# Patient Record
Sex: Female | Born: 1950 | Race: Asian | Hispanic: No
Health system: Southern US, Community
[De-identification: ages and names within clinical notes are randomized; demographics above are authoritative.]

## PROBLEM LIST (undated history)

## (undated) DIAGNOSIS — J449 Chronic obstructive pulmonary disease, unspecified: Secondary | ICD-10-CM

## (undated) DIAGNOSIS — J9621 Acute and chronic respiratory failure with hypoxia: Secondary | ICD-10-CM

## (undated) DIAGNOSIS — J181 Lobar pneumonia, unspecified organism: Secondary | ICD-10-CM

## (undated) DIAGNOSIS — Z9911 Dependence on respirator [ventilator] status: Secondary | ICD-10-CM

---

## 2019-11-29 ENCOUNTER — Other Ambulatory Visit (HOSPITAL_COMMUNITY): Payer: Medicare Other

## 2019-11-29 ENCOUNTER — Inpatient Hospital Stay
Admission: RE | Admit: 2019-11-29 | Discharge: 2019-12-17 | Disposition: A | Discharge status: Other Acute Inpatient Hospital | Payer: Medicare Other

## 2019-11-29 DIAGNOSIS — J939 Pneumothorax, unspecified: Secondary | ICD-10-CM

## 2019-11-29 DIAGNOSIS — Z9289 Personal history of other medical treatment: Secondary | ICD-10-CM

## 2019-11-29 DIAGNOSIS — Z95828 Presence of other vascular implants and grafts: Secondary | ICD-10-CM

## 2019-11-29 DIAGNOSIS — J449 Chronic obstructive pulmonary disease, unspecified: Secondary | ICD-10-CM | POA: Diagnosis present

## 2019-11-29 DIAGNOSIS — J9621 Acute and chronic respiratory failure with hypoxia: Secondary | ICD-10-CM | POA: Diagnosis present

## 2019-11-29 DIAGNOSIS — J811 Chronic pulmonary edema: Secondary | ICD-10-CM

## 2019-11-29 DIAGNOSIS — J9690 Respiratory failure, unspecified, unspecified whether with hypoxia or hypercapnia: Secondary | ICD-10-CM

## 2019-11-29 DIAGNOSIS — J181 Lobar pneumonia, unspecified organism: Secondary | ICD-10-CM | POA: Diagnosis present

## 2019-11-29 DIAGNOSIS — J189 Pneumonia, unspecified organism: Secondary | ICD-10-CM

## 2019-11-29 DIAGNOSIS — E049 Nontoxic goiter, unspecified: Secondary | ICD-10-CM

## 2019-11-29 DIAGNOSIS — Z4659 Encounter for fitting and adjustment of other gastrointestinal appliance and device: Secondary | ICD-10-CM

## 2019-11-29 DIAGNOSIS — Z0189 Encounter for other specified special examinations: Secondary | ICD-10-CM

## 2019-11-29 DIAGNOSIS — Z9911 Dependence on respirator [ventilator] status: Secondary | ICD-10-CM

## 2019-11-29 HISTORY — DX: Acute and chronic respiratory failure with hypoxia: J96.21

## 2019-11-29 HISTORY — DX: Dependence on respirator (ventilator) status: Z99.11

## 2019-11-29 HISTORY — DX: Lobar pneumonia, unspecified organism: J18.1

## 2019-11-29 HISTORY — DX: Chronic obstructive pulmonary disease, unspecified: J44.9

## 2019-11-29 LAB — CBC WITH DIFFERENTIAL/PLATELET
Abs Immature Granulocytes: 0 10*3/uL (ref 0.00–0.07)
Basophils Absolute: 0 10*3/uL (ref 0.0–0.1)
Basophils Relative: 0 %
Eosinophils Absolute: 0 10*3/uL (ref 0.0–0.5)
Eosinophils Relative: 0 %
HCT: 30.5 % — ABNORMAL LOW (ref 36.0–46.0)
Hemoglobin: 9.4 g/dL — ABNORMAL LOW (ref 12.0–15.0)
Lymphocytes Relative: 1 %
Lymphs Abs: 0.4 10*3/uL — ABNORMAL LOW (ref 0.7–4.0)
MCH: 19.8 pg — ABNORMAL LOW (ref 26.0–34.0)
MCHC: 30.8 g/dL (ref 30.0–36.0)
MCV: 64.2 fL — ABNORMAL LOW (ref 80.0–100.0)
Monocytes Absolute: 0.4 10*3/uL (ref 0.1–1.0)
Monocytes Relative: 1 %
Neutro Abs: 36.8 10*3/uL — ABNORMAL HIGH (ref 1.7–7.7)
Neutrophils Relative %: 98 %
Platelets: 229 10*3/uL (ref 150–400)
RBC: 4.75 MIL/uL (ref 3.87–5.11)
RDW: 15.7 % — ABNORMAL HIGH (ref 11.5–15.5)
WBC: 37.6 10*3/uL — ABNORMAL HIGH (ref 4.0–10.5)
nRBC: 0.4 % — ABNORMAL HIGH (ref 0.0–0.2)
nRBC: 1 /100 WBC — ABNORMAL HIGH

## 2019-11-29 LAB — COMPREHENSIVE METABOLIC PANEL
ALT: 80 U/L — ABNORMAL HIGH (ref 0–44)
AST: 40 U/L (ref 15–41)
Albumin: 2.4 g/dL — ABNORMAL LOW (ref 3.5–5.0)
Alkaline Phosphatase: 43 U/L (ref 38–126)
Anion gap: 7 (ref 5–15)
BUN: 37 mg/dL — ABNORMAL HIGH (ref 8–23)
CO2: 28 mmol/L (ref 22–32)
Calcium: 8.2 mg/dL — ABNORMAL LOW (ref 8.9–10.3)
Chloride: 104 mmol/L (ref 98–111)
Creatinine, Ser: 0.64 mg/dL (ref 0.44–1.00)
GFR calc Af Amer: >60 mL/min (ref >60)
GFR calc non Af Amer: >60 mL/min (ref >60)
Glucose, Bld: 193 mg/dL — ABNORMAL HIGH (ref 70–99)
Potassium: 5.2 mmol/L — ABNORMAL HIGH (ref 3.5–5.1)
Sodium: 139 mmol/L (ref 135–145)
Total Bilirubin: 0.7 mg/dL (ref 0.3–1.2)
Total Protein: 4.9 g/dL — ABNORMAL LOW (ref 6.5–8.1)

## 2019-11-29 LAB — BLOOD GAS, ARTERIAL
Acid-Base Excess: 5.7 mmol/L — ABNORMAL HIGH (ref 0.0–2.0)
Bicarbonate: 30 mmol/L — ABNORMAL HIGH (ref 20.0–28.0)
FIO2: 30
O2 Saturation: 97.8 %
Patient temperature: 37.5
pCO2 arterial: 47.2 mmHg (ref 32.0–48.0)
pH, Arterial: 7.422 (ref 7.350–7.450)
pO2, Arterial: 98.4 mmHg (ref 83.0–108.0)

## 2019-11-29 LAB — HEMOGLOBIN A1C
Hgb A1c MFr Bld: 7 % — ABNORMAL HIGH (ref 4.8–5.6)
Mean Plasma Glucose: 154.2 mg/dL

## 2019-11-29 LAB — TSH: TSH: <0.01 u[IU]/mL — ABNORMAL LOW (ref 0.350–4.500)

## 2019-11-30 ENCOUNTER — Other Ambulatory Visit (HOSPITAL_COMMUNITY): Payer: Medicare Other

## 2019-11-30 LAB — BASIC METABOLIC PANEL
Anion gap: 9 (ref 5–15)
BUN: 36 mg/dL — ABNORMAL HIGH (ref 8–23)
CO2: 28 mmol/L (ref 22–32)
Calcium: 8.3 mg/dL — ABNORMAL LOW (ref 8.9–10.3)
Chloride: 99 mmol/L (ref 98–111)
Creatinine, Ser: 0.65 mg/dL (ref 0.44–1.00)
GFR calc Af Amer: >60 mL/min (ref >60)
GFR calc non Af Amer: >60 mL/min (ref >60)
Glucose, Bld: 226 mg/dL — ABNORMAL HIGH (ref 70–99)
Potassium: 4.8 mmol/L (ref 3.5–5.1)
Sodium: 136 mmol/L (ref 135–145)

## 2019-12-01 LAB — BLOOD GAS, ARTERIAL
Acid-Base Excess: 7.9 mmol/L — ABNORMAL HIGH (ref 0.0–2.0)
Bicarbonate: 32.9 mmol/L — ABNORMAL HIGH (ref 20.0–28.0)
FIO2: 35
O2 Saturation: 95.3 %
Patient temperature: 37
pCO2 arterial: 55.7 mmHg — ABNORMAL HIGH (ref 32.0–48.0)
pH, Arterial: 7.389 (ref 7.350–7.450)
pO2, Arterial: 78.2 mmHg — ABNORMAL LOW (ref 83.0–108.0)

## 2019-12-02 LAB — CBC
HCT: 30.6 % — ABNORMAL LOW (ref 36.0–46.0)
Hemoglobin: 9.5 g/dL — ABNORMAL LOW (ref 12.0–15.0)
MCH: 20.3 pg — ABNORMAL LOW (ref 26.0–34.0)
MCHC: 31 g/dL (ref 30.0–36.0)
MCV: 65.5 fL — ABNORMAL LOW (ref 80.0–100.0)
Platelets: 280 10*3/uL (ref 150–400)
RBC: 4.67 MIL/uL (ref 3.87–5.11)
RDW: 16.7 % — ABNORMAL HIGH (ref 11.5–15.5)
WBC: 29 10*3/uL — ABNORMAL HIGH (ref 4.0–10.5)
nRBC: 0.3 % — ABNORMAL HIGH (ref 0.0–0.2)

## 2019-12-02 LAB — BASIC METABOLIC PANEL
Anion gap: 13 (ref 5–15)
BUN: 42 mg/dL — ABNORMAL HIGH (ref 8–23)
CO2: 32 mmol/L (ref 22–32)
Calcium: 8.4 mg/dL — ABNORMAL LOW (ref 8.9–10.3)
Chloride: 93 mmol/L — ABNORMAL LOW (ref 98–111)
Creatinine, Ser: 0.79 mg/dL (ref 0.44–1.00)
GFR calc Af Amer: >60 mL/min (ref >60)
GFR calc non Af Amer: >60 mL/min (ref >60)
Glucose, Bld: 259 mg/dL — ABNORMAL HIGH (ref 70–99)
Potassium: 4.2 mmol/L (ref 3.5–5.1)
Sodium: 138 mmol/L (ref 135–145)

## 2019-12-03 ENCOUNTER — Other Ambulatory Visit (HOSPITAL_COMMUNITY): Payer: Medicare Other

## 2019-12-03 LAB — BLOOD GAS, ARTERIAL
Acid-Base Excess: 12.4 mmol/L — ABNORMAL HIGH (ref 0.0–2.0)
Acid-Base Excess: 14.1 mmol/L — ABNORMAL HIGH (ref 0.0–2.0)
Bicarbonate: 39.1 mmol/L — ABNORMAL HIGH (ref 20.0–28.0)
Bicarbonate: 40.8 mmol/L — ABNORMAL HIGH (ref 20.0–28.0)
FIO2: 45
FIO2: 45
O2 Saturation: 89.5 %
O2 Saturation: 94.7 %
Patient temperature: 37
Patient temperature: 37
pCO2 arterial: 83.3 mmHg (ref 32.0–48.0)
pCO2 arterial: 83.3 mmHg (ref 32.0–48.0)
pH, Arterial: 7.294 — ABNORMAL LOW (ref 7.350–7.450)
pH, Arterial: 7.311 — ABNORMAL LOW (ref 7.350–7.450)
pO2, Arterial: 63.6 mmHg — ABNORMAL LOW (ref 83.0–108.0)
pO2, Arterial: 81.1 mmHg — ABNORMAL LOW (ref 83.0–108.0)

## 2019-12-03 LAB — BASIC METABOLIC PANEL
Anion gap: 13 (ref 5–15)
BUN: 45 mg/dL — ABNORMAL HIGH (ref 8–23)
CO2: 34 mmol/L — ABNORMAL HIGH (ref 22–32)
Calcium: 8.5 mg/dL — ABNORMAL LOW (ref 8.9–10.3)
Chloride: 93 mmol/L — ABNORMAL LOW (ref 98–111)
Creatinine, Ser: 0.71 mg/dL (ref 0.44–1.00)
GFR calc Af Amer: >60 mL/min (ref >60)
GFR calc non Af Amer: >60 mL/min (ref >60)
Glucose, Bld: 422 mg/dL — ABNORMAL HIGH (ref 70–99)
Potassium: 5.5 mmol/L — ABNORMAL HIGH (ref 3.5–5.1)
Sodium: 140 mmol/L (ref 135–145)

## 2019-12-03 LAB — CBC
HCT: 26.7 % — ABNORMAL LOW (ref 36.0–46.0)
Hemoglobin: 8.1 g/dL — ABNORMAL LOW (ref 12.0–15.0)
MCH: 19.7 pg — ABNORMAL LOW (ref 26.0–34.0)
MCHC: 30.3 g/dL (ref 30.0–36.0)
MCV: 65 fL — ABNORMAL LOW (ref 80.0–100.0)
Platelets: 269 10*3/uL (ref 150–400)
RBC: 4.11 MIL/uL (ref 3.87–5.11)
RDW: 16.3 % — ABNORMAL HIGH (ref 11.5–15.5)
WBC: 26.4 10*3/uL — ABNORMAL HIGH (ref 4.0–10.5)
nRBC: 0.3 % — ABNORMAL HIGH (ref 0.0–0.2)

## 2019-12-03 LAB — LACTIC ACID, PLASMA
Lactic Acid, Venous: 2.2 mmol/L (ref 0.5–1.9)
Lactic Acid, Venous: 2.4 mmol/L (ref 0.5–1.9)

## 2019-12-04 LAB — CBC
HCT: 31.9 % — ABNORMAL LOW (ref 36.0–46.0)
Hemoglobin: 9.2 g/dL — ABNORMAL LOW (ref 12.0–15.0)
MCH: 19.8 pg — ABNORMAL LOW (ref 26.0–34.0)
MCHC: 28.8 g/dL — ABNORMAL LOW (ref 30.0–36.0)
MCV: 68.8 fL — ABNORMAL LOW (ref 80.0–100.0)
Platelets: 296 10*3/uL (ref 150–400)
RBC: 4.64 MIL/uL (ref 3.87–5.11)
RDW: 17.3 % — ABNORMAL HIGH (ref 11.5–15.5)
WBC: 32.1 10*3/uL — ABNORMAL HIGH (ref 4.0–10.5)
nRBC: 0.3 % — ABNORMAL HIGH (ref 0.0–0.2)

## 2019-12-04 LAB — BASIC METABOLIC PANEL
Anion gap: 8 (ref 5–15)
Anion gap: 9 (ref 5–15)
BUN: 60 mg/dL — ABNORMAL HIGH (ref 8–23)
BUN: 62 mg/dL — ABNORMAL HIGH (ref 8–23)
CO2: 37 mmol/L — ABNORMAL HIGH (ref 22–32)
CO2: 37 mmol/L — ABNORMAL HIGH (ref 22–32)
Calcium: 8.3 mg/dL — ABNORMAL LOW (ref 8.9–10.3)
Calcium: 8.2 mg/dL — ABNORMAL LOW (ref 8.9–10.3)
Chloride: 93 mmol/L — ABNORMAL LOW (ref 98–111)
Chloride: 93 mmol/L — ABNORMAL LOW (ref 98–111)
Creatinine, Ser: 0.71 mg/dL (ref 0.44–1.00)
Creatinine, Ser: 0.67 mg/dL (ref 0.44–1.00)
GFR calc Af Amer: >60 mL/min (ref >60)
GFR calc Af Amer: >60 mL/min (ref >60)
GFR calc non Af Amer: >60 mL/min (ref >60)
GFR calc non Af Amer: >60 mL/min (ref >60)
Glucose, Bld: 268 mg/dL — ABNORMAL HIGH (ref 70–99)
Glucose, Bld: 295 mg/dL — ABNORMAL HIGH (ref 70–99)
Potassium: 5.2 mmol/L — ABNORMAL HIGH (ref 3.5–5.1)
Potassium: 4.5 mmol/L (ref 3.5–5.1)
Sodium: 138 mmol/L (ref 135–145)
Sodium: 139 mmol/L (ref 135–145)

## 2019-12-04 LAB — LACTIC ACID, PLASMA
Lactic Acid, Venous: 1.3 mmol/L (ref 0.5–1.9)
Lactic Acid, Venous: 2.2 mmol/L (ref 0.5–1.9)

## 2019-12-05 ENCOUNTER — Other Ambulatory Visit (HOSPITAL_COMMUNITY): Payer: Medicare Other

## 2019-12-05 LAB — CBC
HCT: 25.4 % — ABNORMAL LOW (ref 36.0–46.0)
Hemoglobin: 7.4 g/dL — ABNORMAL LOW (ref 12.0–15.0)
MCH: 19.9 pg — ABNORMAL LOW (ref 26.0–34.0)
MCHC: 29.1 g/dL — ABNORMAL LOW (ref 30.0–36.0)
MCV: 68.5 fL — ABNORMAL LOW (ref 80.0–100.0)
Platelets: 267 10*3/uL (ref 150–400)
RBC: 3.71 MIL/uL — ABNORMAL LOW (ref 3.87–5.11)
RDW: 17.2 % — ABNORMAL HIGH (ref 11.5–15.5)
WBC: 20.7 10*3/uL — ABNORMAL HIGH (ref 4.0–10.5)
nRBC: 0.5 % — ABNORMAL HIGH (ref 0.0–0.2)

## 2019-12-05 LAB — BASIC METABOLIC PANEL
Anion gap: 7 (ref 5–15)
BUN: 72 mg/dL — ABNORMAL HIGH (ref 8–23)
CO2: 39 mmol/L — ABNORMAL HIGH (ref 22–32)
Calcium: 8.6 mg/dL — ABNORMAL LOW (ref 8.9–10.3)
Chloride: 96 mmol/L — ABNORMAL LOW (ref 98–111)
Creatinine, Ser: 0.65 mg/dL (ref 0.44–1.00)
GFR calc Af Amer: >60 mL/min (ref >60)
GFR calc non Af Amer: >60 mL/min (ref >60)
Glucose, Bld: 243 mg/dL — ABNORMAL HIGH (ref 70–99)
Potassium: 5.5 mmol/L — ABNORMAL HIGH (ref 3.5–5.1)
Sodium: 142 mmol/L (ref 135–145)

## 2019-12-05 LAB — CULTURE, RESPIRATORY W GRAM STAIN

## 2019-12-06 DIAGNOSIS — J9620 Acute and chronic respiratory failure, unspecified whether with hypoxia or hypercapnia: Secondary | ICD-10-CM

## 2019-12-06 LAB — BLOOD GAS, ARTERIAL
Acid-Base Excess: 15.2 mmol/L — ABNORMAL HIGH (ref 0.0–2.0)
Acid-Base Excess: 14.9 mmol/L — ABNORMAL HIGH (ref 0.0–2.0)
Acid-Base Excess: 15.9 mmol/L — ABNORMAL HIGH (ref 0.0–2.0)
Acid-Base Excess: 15.1 mmol/L — ABNORMAL HIGH (ref 0.0–2.0)
Bicarbonate: 43.4 mmol/L — ABNORMAL HIGH (ref 20.0–28.0)
Bicarbonate: 42 mmol/L — ABNORMAL HIGH (ref 20.0–28.0)
Bicarbonate: 42.7 mmol/L — ABNORMAL HIGH (ref 20.0–28.0)
Bicarbonate: 40.7 mmol/L — ABNORMAL HIGH (ref 20.0–28.0)
FIO2: 45
FIO2: 45
FIO2: 45
FIO2: 35
O2 Saturation: 93.1 %
O2 Saturation: 97.2 %
O2 Saturation: 93.7 %
O2 Saturation: 92.4 %
Patient temperature: 36.7
Patient temperature: 37
Patient temperature: 37.2
Patient temperature: 36.7
pCO2 arterial: 112 mmHg (ref 32.0–48.0)
pCO2 arterial: 91.5 mmHg (ref 32.0–48.0)
pCO2 arterial: 87.9 mmHg (ref 32.0–48.0)
pCO2 arterial: 64.8 mmHg — ABNORMAL HIGH (ref 32.0–48.0)
pH, Arterial: 7.211 — ABNORMAL LOW (ref 7.350–7.450)
pH, Arterial: 7.284 — ABNORMAL LOW (ref 7.350–7.450)
pH, Arterial: 7.309 — ABNORMAL LOW (ref 7.350–7.450)
pH, Arterial: 7.412 (ref 7.350–7.450)
pO2, Arterial: 76.1 mmHg — ABNORMAL LOW (ref 83.0–108.0)
pO2, Arterial: 102 mmHg (ref 83.0–108.0)
pO2, Arterial: 73 mmHg — ABNORMAL LOW (ref 83.0–108.0)
pO2, Arterial: 59.2 mmHg — ABNORMAL LOW (ref 83.0–108.0)

## 2019-12-06 LAB — VANCOMYCIN, TROUGH: Vancomycin Tr: 17 ug/mL (ref 15–20)

## 2019-12-06 LAB — POTASSIUM: Potassium: 4.7 mmol/L (ref 3.5–5.1)

## 2019-12-08 ENCOUNTER — Other Ambulatory Visit (HOSPITAL_COMMUNITY): Payer: Medicare Other

## 2019-12-08 DIAGNOSIS — J449 Chronic obstructive pulmonary disease, unspecified: Secondary | ICD-10-CM | POA: Diagnosis not present

## 2019-12-08 DIAGNOSIS — Z978 Presence of other specified devices: Secondary | ICD-10-CM

## 2019-12-08 DIAGNOSIS — Z9911 Dependence on respirator [ventilator] status: Secondary | ICD-10-CM | POA: Diagnosis not present

## 2019-12-08 DIAGNOSIS — J181 Lobar pneumonia, unspecified organism: Secondary | ICD-10-CM

## 2019-12-08 DIAGNOSIS — J9621 Acute and chronic respiratory failure with hypoxia: Secondary | ICD-10-CM

## 2019-12-08 NOTE — Consult Note (Signed)
Pulmonary [place]  Date of Service: 12/08/2019  PULMONARY CRITICAL CARE Shelley Nicholson  ZDG:[id number]  DOB: 1950/08/30   DOA: 11/29/2019  Referring Physician: Merton Border, MD  HPI: Shelley Nicholson is a 69 y.o. female seen for follow up of Acute on Chronic Respiratory Failure.  Patient has multiple medical problems including hypertension COPD who came into the hospital with an acute exacerbation of COPD.  Patient is already mentioned has a history of COPD initially was placed on BiPAP however was not able to tolerate BiPAP so therefore ended up intubated on the ventilator.  Patient currently remains on the ventilator endotracheal tube in place and also is requiring sedation with fentanyl as well as propofol.  Patient's blood pressure and hemodynamics have been tenuous.  Apparently was transferred to fentanyl and propofol.  It appears the patient was intubated on June 17 and has not been able to successfully wean.  Patient weaning attempts here have also been unsuccessful so far as they have not been able to wean down the sedation.  Should also be noted that at the other facility patient was extubated but had to be reintubated on June 17.  Review of Systems:  ROS performed and is unremarkable other than noted above.  Past medical history: Chronic arthritis Severe COPD Goiter Respiratory failure  Surgical history: Unknown  Family history: Noncontributory to the present illness  Social history: Patient tobacco smoker unknown current status  Medications: Reviewed on Rounds  Physical Exam:  Vitals: Temperature 96.7 pulse 99 respiratory rate 24 blood pressure is 123/60 saturations 100%  Ventilator Settings on assist control FiO2 35% tidal volume is 350 PEEP 5  . General: Comfortable at this time . Eyes: Grossly normal lids, irises & conjunctiva . ENT: grossly tongue is normal . Neck: no obvious  mass . Cardiovascular: S1-S2 normal no gallop rub . Respiratory: Scattered rhonchi expansion equal . Abdomen: Soft and nontender . Skin: no rash seen on limited exam . Musculoskeletal: not rigid . Psychiatric:unable to assess . Neurologic: no seizure no involuntary movements         Labs on Admission:  Basic Metabolic Panel: Recent Labs  Lab 12/02/19 0401 12/02/19 0401 12/03/19 0514 12/04/19 0546 12/04/19 1509 12/05/19 0731 12/06/19 0957  NA 138  --  140 138 139 142  --   K 4.2   < > 5.5* 5.2* 4.5 5.5* 4.7  CL 93*  --  93* 93* 93* 96*  --   CO2 32  --  34* 37* 37* 39*  --   GLUCOSE 259*  --  422* 268* 295* 243*  --   BUN 42*  --  45* 60* 62* 72*  --   CREATININE 0.79  --  0.71 0.71 0.67 0.65  --   CALCIUM 8.4*  --  8.5* 8.3* 8.2* 8.6*  --    < > = values in this interval not displayed.    Recent Labs  Lab 12/03/19 0936 12/06/19 0153 12/06/19 0504 12/06/19 1440 12/06/19 1758  PHART 7.311* 7.211* 7.284* 7.309* 7.412  PCO2ART 83.3* 112* 91.5* 87.9* 64.8*  PO2ART 81.1* 76.1* 102 73.0* 59.2*  HCO3 40.8* 43.4* 42.0* 42.7* 40.7*  O2SAT 94.7 93.1 97.2 93.7 92.4    Liver Function Tests: No results for input(s): AST, ALT, ALKPHOS, BILITOT, PROT, ALBUMIN in the last 168 hours. No results for input(s): LIPASE, AMYLASE in the last 168 hours. No results for input(s): AMMONIA in the last 168  hours.  CBC: Recent Labs  Lab 12/02/19 0401 12/03/19 0957 12/04/19 0546 12/05/19 0731  WBC 29.0* 26.4* 32.1* 20.7*  HGB 9.5* 8.1* 9.2* 7.4*  HCT 30.6* 26.7* 31.9* 25.4*  MCV 65.5* 65.0* 68.8* 68.5*  PLT 280 269 296 267    Cardiac Enzymes: No results for input(s): CKTOTAL, CKMB, CKMBINDEX, TROPONINI in the last 168 hours.  BNP (last 3 results) No results for input(s): BNP in the last 8760 hours.  ProBNP (last 3 results) No results for input(s): PROBNP in the last 8760 hours.   Radiological Exams on Admission: DG CHEST PORT 1 VIEW  Result Date: 12/08/2019 CLINICAL DATA:   69 year old female with a history of respiratory failure EXAM: PORTABLE CHEST 1 VIEW COMPARISON:  12/03/2019 11/29/2019 FINDINGS: Cardiomediastinal silhouette unchanged in size and contour. Unchanged endotracheal tube which terminates approximately 2.5 cm above the carina. Unchanged gastric tube terminating out of the field of view. Reticular opacities at the lung bases, similar to the comparison. Unchanged right upper extremity PICC. No pneumothorax or pleural effusion. IMPRESSION: Similar appearance of the chest x-ray with reticular opacities at the bilateral lung bases worsened since the chest x-ray 11/29/2019, either atelectasis and/or consolidation. Unchanged endotracheal tube, right upper extremity PICC, and gastric tube. Electronically Signed   By: Corrie Mckusick D.O.   On: 12/08/2019 14:29   DG Abd Portable 1V  Result Date: 12/05/2019 CLINICAL DATA:  Orogastric tube placement EXAM: PORTABLE ABDOMEN - 1 VIEW COMPARISON:  November 29, 2019. FINDINGS: Orogastric tube tip and side port are in the stomach. There is no bowel dilatation or air-fluid level to suggest bowel obstruction. No free air. Lung bases clear. IMPRESSION: Orogastric tube tip and side port in stomach. No bowel obstruction or free air evident. Electronically Signed   By: Lowella Grip III M.D.   On: 12/05/2019 11:41    Assessment/Plan Active Problems:   Acute on chronic respiratory failure with hypoxia (HCC)   COPD, severe (HCC)   Lobar pneumonia, unspecified organism ([place])   Ventilator dependence ([place])   1. Acute on chronic respiratory failure with hypoxia right now not weaning patient was on full support on the ventilator.  The patient's chest x-ray had shown reticular opacities in the lungs and endotracheal tube in place.  Patient will be reassessed again for weaning protocol.  However due to the fact the patient has already failed extubation x1 and we are now over 2 weeks will likely need a tracheostomy will discuss further  with primary care team as well as the ENT 2. Severe COPD patient will need ongoing medical management we will continue to monitor closely.  Patient will need duo nebs as necessary. 3. Possible pneumonia patient's chest x-ray showing some worsening changes at the bases on the most recent chest film.  May need to consider addition of antibiotics we will discuss further with primary care team. 4. Ventilator dependence patient right now remains on the ventilator will likely end up needing a tracheostomy.  I have personally seen and evaluated the patient, evaluated laboratory and imaging results, formulated the assessment and plan and placed orders. The Patient requires high complexity decision making with multiple systems involvement.  Case was discussed on Rounds with the Respiratory Therapy Director and the Respiratory staff Time Spent 37minutes patient is critically ill and in danger of cardiac arrest and death and requires titration of advanced drips  Allyne Gee, MD [facility] Pulmonary Critical Care Medicine Sleep Medicine

## 2019-12-09 DIAGNOSIS — J449 Chronic obstructive pulmonary disease, unspecified: Secondary | ICD-10-CM | POA: Diagnosis not present

## 2019-12-09 DIAGNOSIS — Z9911 Dependence on respirator [ventilator] status: Secondary | ICD-10-CM

## 2019-12-09 DIAGNOSIS — J9621 Acute and chronic respiratory failure with hypoxia: Secondary | ICD-10-CM | POA: Diagnosis not present

## 2019-12-09 DIAGNOSIS — J181 Lobar pneumonia, unspecified organism: Secondary | ICD-10-CM | POA: Diagnosis present

## 2019-12-09 LAB — CBC WITH DIFFERENTIAL/PLATELET
Abs Immature Granulocytes: 0.55 10*3/uL — ABNORMAL HIGH (ref 0.00–0.07)
Basophils Absolute: 0 10*3/uL (ref 0.0–0.1)
Basophils Relative: 0 %
Eosinophils Absolute: 0 10*3/uL (ref 0.0–0.5)
Eosinophils Relative: 0 %
HCT: 24.9 % — ABNORMAL LOW (ref 36.0–46.0)
Hemoglobin: 7.4 g/dL — ABNORMAL LOW (ref 12.0–15.0)
Immature Granulocytes: 3 %
Lymphocytes Relative: 2 %
Lymphs Abs: 0.3 10*3/uL — ABNORMAL LOW (ref 0.7–4.0)
MCH: 19.8 pg — ABNORMAL LOW (ref 26.0–34.0)
MCHC: 29.7 g/dL — ABNORMAL LOW (ref 30.0–36.0)
MCV: 66.8 fL — ABNORMAL LOW (ref 80.0–100.0)
Monocytes Absolute: 0.6 10*3/uL (ref 0.1–1.0)
Monocytes Relative: 3 %
Neutro Abs: 19.6 10*3/uL — ABNORMAL HIGH (ref 1.7–7.7)
Neutrophils Relative %: 92 %
Platelets: 259 10*3/uL (ref 150–400)
RBC: 3.73 MIL/uL — ABNORMAL LOW (ref 3.87–5.11)
RDW: 18.3 % — ABNORMAL HIGH (ref 11.5–15.5)
WBC: 21 10*3/uL — ABNORMAL HIGH (ref 4.0–10.5)
nRBC: 0.9 % — ABNORMAL HIGH (ref 0.0–0.2)

## 2019-12-09 LAB — CULTURE, BLOOD (ROUTINE X 2)
Culture: NO GROWTH
Culture: NO GROWTH
Special Requests: ADEQUATE
Special Requests: ADEQUATE

## 2019-12-09 NOTE — Progress Notes (Signed)
Pulmonary Critical Care Medicine [place]   PULMONARY CRITICAL CARE SERVICE  PROGRESS NOTE  Date of Service: 12/09/2019  Marykate Heuberger  IRC:[id number]  DOB: 04-26-1951   DOA: 11/29/2019  Referring Physician: Merton Border, MD  HPI: Shelley Nicholson is a 69 y.o. female seen for follow up of Acute on Chronic Respiratory Failure.  Patient remains on the ventilator still is on fentanyl as well as propofol.  The patient has not been tolerating any attempts at weaning thus far.  I will place a consultation in for ENT to see the patient for tracheostomy this week  Medications: Reviewed on Rounds  Physical Exam:  Vitals: Temperature is 97.5 pulse 97 respiratory rate 32 blood pressure is 143/84 saturations 98%  Ventilator Settings on the ventilator assist control FiO2 35% tidal volume 350 PEEP 5  . General: Comfortable at this time . Eyes: Grossly normal lids, irises & conjunctiva . ENT: grossly tongue is normal . Neck: no obvious mass . Cardiovascular: S1 S2 normal no gallop . Respiratory: Scattered rhonchi are noted bilaterally . Abdomen: soft . Skin: no rash seen on limited exam . Musculoskeletal: not rigid . Psychiatric:unable to assess . Neurologic: no seizure no involuntary movements         Lab Data:   Basic Metabolic Panel: Recent Labs  Lab 12/03/19 0514 12/04/19 0546 12/04/19 1509 12/05/19 0731 12/06/19 0957  NA 140 138 139 142  --   K 5.5* 5.2* 4.5 5.5* 4.7  CL 93* 93* 93* 96*  --   CO2 34* 37* 37* 39*  --   GLUCOSE 422* 268* 295* 243*  --   BUN 45* 60* 62* 72*  --   CREATININE 0.71 0.71 0.67 0.65  --   CALCIUM 8.5* 8.3* 8.2* 8.6*  --     ABG: Recent Labs  Lab 12/03/19 0936 12/06/19 0153 12/06/19 0504 12/06/19 1440 12/06/19 1758  PHART 7.311* 7.211* 7.284* 7.309* 7.412  PCO2ART 83.3* 112* 91.5* 87.9* 64.8*  PO2ART 81.1* 76.1* 102 73.0* 59.2*  HCO3 40.8* 43.4* 42.0* 42.7* 40.7*  O2SAT 94.7 93.1 97.2 93.7 92.4    Liver  Function Tests: No results for input(s): AST, ALT, ALKPHOS, BILITOT, PROT, ALBUMIN in the last 168 hours. No results for input(s): LIPASE, AMYLASE in the last 168 hours. No results for input(s): AMMONIA in the last 168 hours.  CBC: Recent Labs  Lab 12/03/19 0957 12/04/19 0546 12/05/19 0731 12/09/19 0501  WBC 26.4* 32.1* 20.7* 21.0*  NEUTROABS  --   --   --  19.6*  HGB 8.1* 9.2* 7.4* 7.4*  HCT 26.7* 31.9* 25.4* 24.9*  MCV 65.0* 68.8* 68.5* 66.8*  PLT 269 296 267 259    Cardiac Enzymes: No results for input(s): CKTOTAL, CKMB, CKMBINDEX, TROPONINI in the last 168 hours.  BNP (last 3 results) No results for input(s): BNP in the last 8760 hours.  ProBNP (last 3 results) No results for input(s): PROBNP in the last 8760 hours.  Radiological Exams: DG CHEST PORT 1 VIEW  Result Date: 12/08/2019 CLINICAL DATA:  69 year old female with a history of respiratory failure EXAM: PORTABLE CHEST 1 VIEW COMPARISON:  12/03/2019 11/29/2019 FINDINGS: Cardiomediastinal silhouette unchanged in size and contour. Unchanged endotracheal tube which terminates approximately 2.5 cm above the carina. Unchanged gastric tube terminating out of the field of view. Reticular opacities at the lung bases, similar to the comparison. Unchanged right upper extremity PICC. No pneumothorax or pleural effusion. IMPRESSION: Similar appearance of the chest x-ray with reticular opacities at the  bilateral lung bases worsened since the chest x-ray 11/29/2019, either atelectasis and/or consolidation. Unchanged endotracheal tube, right upper extremity PICC, and gastric tube. Electronically Signed   By: Corrie Mckusick D.O.   On: 12/08/2019 14:29    Assessment/Plan Active Problems:   Acute on chronic respiratory failure with hypoxia (HCC)   COPD, severe (HCC)   Lobar pneumonia, unspecified organism ([place])   Ventilator dependence ([place])   1. Acute on chronic respiratory failure hypoxia patient continues to remain on the ventilator  continues to be on fentanyl and propofol orally intubated.  Has not been tolerating any attempts at weaning.  Chest x-ray results as already noted for possibility of pneumonia. 2. Severe COPD medical management as needed nebulizers as needed..  Patient does not show any improvement consider adjustment of steroid needs 3. Lobar pneumonia patient is showing some new changes on the chest film as above.  Discussed with primary care team. 4. Ventilator dependence on the ventilator has failed trial of extubation 5. Endotracheally intubated orally intubated on the vent with sedation   I have personally seen and evaluated the patient, evaluated laboratory and imaging results, formulated the assessment and plan and placed orders. The Patient requires high complexity decision making with multiple systems involvement.  Rounds were done with the Respiratory Therapy Director and Staff therapists and discussed with nursing staff also.  Patient is critically ill in danger of cardiac arrest and death requires ongoing titration of advanced drips propofol fentanyl time 35 minutes  Allyne Gee, MD [facility] Pulmonary Critical Care Medicine Sleep Medicine

## 2019-12-10 DIAGNOSIS — Z9911 Dependence on respirator [ventilator] status: Secondary | ICD-10-CM | POA: Diagnosis not present

## 2019-12-10 DIAGNOSIS — J181 Lobar pneumonia, unspecified organism: Secondary | ICD-10-CM | POA: Diagnosis not present

## 2019-12-10 DIAGNOSIS — J9621 Acute and chronic respiratory failure with hypoxia: Secondary | ICD-10-CM | POA: Diagnosis not present

## 2019-12-10 DIAGNOSIS — J449 Chronic obstructive pulmonary disease, unspecified: Secondary | ICD-10-CM | POA: Diagnosis not present

## 2019-12-10 NOTE — Progress Notes (Signed)
Pulmonary Critical Care Medicine [place]   PULMONARY CRITICAL CARE SERVICE  PROGRESS NOTE  Date of Service: 12/10/2019  Shelley Nicholson  AOZ:[id number]  DOB: 12-02-1950   DOA: 11/29/2019  Referring Physician: Merton Border, MD  HPI: Shelley Nicholson is a 69 y.o. female seen for follow up of Acute on Chronic Respiratory Failure.  Patient remains on the ventilator and full support on assist control mode currently on 35% FiO2.  The patient still requiring fentanyl and propofol for sedation  Medications: Reviewed on Rounds  Physical Exam:  Vitals: Temperature is 97.2 pulse 79 respiratory rate 24 blood pressure is 120/62 saturations 100%  Ventilator Settings on assist control FiO2 35% respiratory rate 24 tidal line 350 PEEP 5  . General: Comfortable at this time . Eyes: Grossly normal lids, irises & conjunctiva . ENT: grossly tongue is normal . Neck: no obvious mass . Cardiovascular: S1 S2 normal no gallop . Respiratory: No rhonchi coarse breath sounds are noted at this time . Abdomen: soft . Skin: no rash seen on limited exam . Musculoskeletal: not rigid . Psychiatric:unable to assess . Neurologic: no seizure no involuntary movements         Lab Data:   Basic Metabolic Panel: Recent Labs  Lab 12/04/19 0546 12/04/19 1509 12/05/19 0731 12/06/19 0957  NA 138 139 142  --   K 5.2* 4.5 5.5* 4.7  CL 93* 93* 96*  --   CO2 37* 37* 39*  --   GLUCOSE 268* 295* 243*  --   BUN 60* 62* 72*  --   CREATININE 0.71 0.67 0.65  --   CALCIUM 8.3* 8.2* 8.6*  --     ABG: Recent Labs  Lab 12/06/19 0153 12/06/19 0504 12/06/19 1440 12/06/19 1758  PHART 7.211* 7.284* 7.309* 7.412  PCO2ART 112* 91.5* 87.9* 64.8*  PO2ART 76.1* 102 73.0* 59.2*  HCO3 43.4* 42.0* 42.7* 40.7*  O2SAT 93.1 97.2 93.7 92.4    Liver Function Tests: No results for input(s): AST, ALT, ALKPHOS, BILITOT, PROT, ALBUMIN in the last 168 hours. No results for input(s): LIPASE, AMYLASE in  the last 168 hours. No results for input(s): AMMONIA in the last 168 hours.  CBC: Recent Labs  Lab 12/04/19 0546 12/05/19 0731 12/09/19 0501  WBC 32.1* 20.7* 21.0*  NEUTROABS  --   --  19.6*  HGB 9.2* 7.4* 7.4*  HCT 31.9* 25.4* 24.9*  MCV 68.8* 68.5* 66.8*  PLT 296 267 259    Cardiac Enzymes: No results for input(s): CKTOTAL, CKMB, CKMBINDEX, TROPONINI in the last 168 hours.  BNP (last 3 results) No results for input(s): BNP in the last 8760 hours.  ProBNP (last 3 results) No results for input(s): PROBNP in the last 8760 hours.  Radiological Exams: DG CHEST PORT 1 VIEW  Result Date: 12/08/2019 CLINICAL DATA:  69 year old female with a history of respiratory failure EXAM: PORTABLE CHEST 1 VIEW COMPARISON:  12/03/2019 11/29/2019 FINDINGS: Cardiomediastinal silhouette unchanged in size and contour. Unchanged endotracheal tube which terminates approximately 2.5 cm above the carina. Unchanged gastric tube terminating out of the field of view. Reticular opacities at the lung bases, similar to the comparison. Unchanged right upper extremity PICC. No pneumothorax or pleural effusion. IMPRESSION: Similar appearance of the chest x-ray with reticular opacities at the bilateral lung bases worsened since the chest x-ray 11/29/2019, either atelectasis and/or consolidation. Unchanged endotracheal tube, right upper extremity PICC, and gastric tube. Electronically Signed   By: Corrie Mckusick D.O.   On: 12/08/2019 14:29  Assessment/Plan Active Problems:   Acute on chronic respiratory failure with hypoxia (HCC)   COPD, severe (HCC)   Lobar pneumonia, unspecified organism ([place])   Ventilator dependence ([place])   1. Acute on chronic respiratory failure hypoxia patient continues to fail attempts at weaning she will need to have a tracheostomy done we will get ENT evaluation for the tracheostomy.  In the meantime she requires propofol titration as well as fentanyl continue to monitor 2. Severe COPD  patient is at baseline medical management nebulizers as necessary 3. Lobar pneumonia treated we will continue with supportive care 4. Ventilator dependence patient is requiring ongoing sedation still   I have personally seen and evaluated the patient, evaluated laboratory and imaging results, formulated the assessment and plan and placed orders. The Patient requires high complexity decision making with multiple systems involvement.  Rounds were done with the Respiratory Therapy Director and Staff therapists and discussed with nursing staff also.  Time 35 minutes  Allyne Gee, MD [facility] Pulmonary Critical Care Medicine Sleep Medicine

## 2019-12-11 DIAGNOSIS — Z9911 Dependence on respirator [ventilator] status: Secondary | ICD-10-CM | POA: Diagnosis not present

## 2019-12-11 DIAGNOSIS — J181 Lobar pneumonia, unspecified organism: Secondary | ICD-10-CM | POA: Diagnosis not present

## 2019-12-11 DIAGNOSIS — J449 Chronic obstructive pulmonary disease, unspecified: Secondary | ICD-10-CM | POA: Diagnosis not present

## 2019-12-11 DIAGNOSIS — J9621 Acute and chronic respiratory failure with hypoxia: Secondary | ICD-10-CM | POA: Diagnosis not present

## 2019-12-11 LAB — BASIC METABOLIC PANEL
Anion gap: 11 (ref 5–15)
BUN: 102 mg/dL — ABNORMAL HIGH (ref 8–23)
CO2: 34 mmol/L — ABNORMAL HIGH (ref 22–32)
Calcium: 8.4 mg/dL — ABNORMAL LOW (ref 8.9–10.3)
Chloride: 95 mmol/L — ABNORMAL LOW (ref 98–111)
Creatinine, Ser: 0.88 mg/dL (ref 0.44–1.00)
GFR calc Af Amer: >60 mL/min (ref >60)
GFR calc non Af Amer: >60 mL/min (ref >60)
Glucose, Bld: 185 mg/dL — ABNORMAL HIGH (ref 70–99)
Potassium: 5.3 mmol/L — ABNORMAL HIGH (ref 3.5–5.1)
Sodium: 140 mmol/L (ref 135–145)

## 2019-12-11 LAB — CBC
HCT: 22.8 % — ABNORMAL LOW (ref 36.0–46.0)
Hemoglobin: 6.8 g/dL — CL (ref 12.0–15.0)
MCH: 20 pg — ABNORMAL LOW (ref 26.0–34.0)
MCHC: 29.8 g/dL — ABNORMAL LOW (ref 30.0–36.0)
MCV: 67.1 fL — ABNORMAL LOW (ref 80.0–100.0)
Platelets: 281 10*3/uL (ref 150–400)
RBC: 3.4 MIL/uL — ABNORMAL LOW (ref 3.87–5.11)
RDW: 19.1 % — ABNORMAL HIGH (ref 11.5–15.5)
WBC: 17 10*3/uL — ABNORMAL HIGH (ref 4.0–10.5)
nRBC: 0.8 % — ABNORMAL HIGH (ref 0.0–0.2)

## 2019-12-11 LAB — TSH: TSH: <0.01 u[IU]/mL — ABNORMAL LOW (ref 0.350–4.500)

## 2019-12-11 LAB — T4, FREE: Free T4: 0.74 ng/dL (ref 0.61–1.12)

## 2019-12-11 LAB — ABO/RH: ABO/RH(D): B POS

## 2019-12-11 LAB — PREPARE RBC (CROSSMATCH)

## 2019-12-11 NOTE — Progress Notes (Signed)
Pulmonary Critical Care Medicine [place]   PULMONARY CRITICAL CARE SERVICE  PROGRESS NOTE  Date of Service: 12/11/2019  Shelley Nicholson  MEQ:[id number]  DOB: 08-17-1950   DOA: 11/29/2019  Referring Physician: Merton Border, MD  HPI: Shelley Nicholson is a 69 y.o. female seen for follow up of Acute on Chronic Respiratory Failure.  Patient currently is on full support on the ventilator she was attempted at weaning but failed.  Awaiting tracheostomy to be done  Medications: Reviewed on Rounds  Physical Exam:  Vitals: Temperature is 97.5 pulse 112 respiratory 24 blood pressure is 104/50 saturations 97%  Ventilator Settings on assist control FiO2 35% tidal volume 370 PEEP 5  . General: Comfortable at this time . Eyes: Grossly normal lids, irises & conjunctiva . ENT: grossly tongue is normal . Neck: no obvious mass . Cardiovascular: S1 S2 normal no gallop . Respiratory: No rhonchi coarse breath sounds . Abdomen: soft . Skin: no rash seen on limited exam . Musculoskeletal: not rigid . Psychiatric:unable to assess . Neurologic: no seizure no involuntary movements         Lab Data:   Basic Metabolic Panel: Recent Labs  Lab 12/04/19 1509 12/05/19 0731 12/06/19 0957 12/11/19 0501  NA 139 142  --  140  K 4.5 5.5* 4.7 5.3*  CL 93* 96*  --  95*  CO2 37* 39*  --  34*  GLUCOSE 295* 243*  --  185*  BUN 62* 72*  --  102*  CREATININE 0.67 0.65  --  0.88  CALCIUM 8.2* 8.6*  --  8.4*    ABG: Recent Labs  Lab 12/06/19 0153 12/06/19 0504 12/06/19 1440 12/06/19 1758  PHART 7.211* 7.284* 7.309* 7.412  PCO2ART 112* 91.5* 87.9* 64.8*  PO2ART 76.1* 102 73.0* 59.2*  HCO3 43.4* 42.0* 42.7* 40.7*  O2SAT 93.1 97.2 93.7 92.4    Liver Function Tests: No results for input(s): AST, ALT, ALKPHOS, BILITOT, PROT, ALBUMIN in the last 168 hours. No results for input(s): LIPASE, AMYLASE in the last 168 hours. No results for input(s): AMMONIA in the last 168  hours.  CBC: Recent Labs  Lab 12/05/19 0731 12/09/19 0501 12/11/19 0501  WBC 20.7* 21.0* 17.0*  NEUTROABS  --  19.6*  --   HGB 7.4* 7.4* 6.8*  HCT 25.4* 24.9* 22.8*  MCV 68.5* 66.8* 67.1*  PLT 267 259 281    Cardiac Enzymes: No results for input(s): CKTOTAL, CKMB, CKMBINDEX, TROPONINI in the last 168 hours.  BNP (last 3 results) No results for input(s): BNP in the last 8760 hours.  ProBNP (last 3 results) No results for input(s): PROBNP in the last 8760 hours.  Radiological Exams: No results found.  Assessment/Plan Active Problems:   Acute on chronic respiratory failure with hypoxia (HCC)   COPD, severe (HCC)   Lobar pneumonia, unspecified organism ([place])   Ventilator dependence ([place])   1. Acute on chronic respiratory failure with hypoxia we will continue with full support currently on assist control patient has supposed to have a tracheostomy done we will continue to monitor closely until that is done 2. Severe COPD medical management 3. Lobar pneumonia treated clinically appears to be improving 4. Ventilator dependence we will continue with present settings on the vent and full support   I have personally seen and evaluated the patient, evaluated laboratory and imaging results, formulated the assessment and plan and placed orders. The Patient requires high complexity decision making with multiple systems involvement.  Rounds were done with the  Respiratory Therapy Director and Staff therapists and discussed with nursing staff also.  Allyne Gee, MD [facility] Pulmonary Critical Care Medicine Sleep Medicine

## 2019-12-12 DIAGNOSIS — Z9911 Dependence on respirator [ventilator] status: Secondary | ICD-10-CM | POA: Diagnosis not present

## 2019-12-12 DIAGNOSIS — J181 Lobar pneumonia, unspecified organism: Secondary | ICD-10-CM | POA: Diagnosis not present

## 2019-12-12 DIAGNOSIS — J449 Chronic obstructive pulmonary disease, unspecified: Secondary | ICD-10-CM | POA: Diagnosis not present

## 2019-12-12 DIAGNOSIS — J9621 Acute and chronic respiratory failure with hypoxia: Secondary | ICD-10-CM | POA: Diagnosis not present

## 2019-12-12 LAB — CBC
HCT: 31 % — ABNORMAL LOW (ref 36.0–46.0)
Hemoglobin: 9.6 g/dL — ABNORMAL LOW (ref 12.0–15.0)
MCH: 21.9 pg — ABNORMAL LOW (ref 26.0–34.0)
MCHC: 31 g/dL (ref 30.0–36.0)
MCV: 70.6 fL — ABNORMAL LOW (ref 80.0–100.0)
Platelets: 251 10*3/uL (ref 150–400)
RBC: 4.39 MIL/uL (ref 3.87–5.11)
RDW: 22.8 % — ABNORMAL HIGH (ref 11.5–15.5)
WBC: 18.8 10*3/uL — ABNORMAL HIGH (ref 4.0–10.5)
nRBC: 0.6 % — ABNORMAL HIGH (ref 0.0–0.2)

## 2019-12-12 LAB — T3: T3, Total: 38 ng/dL — ABNORMAL LOW (ref 71–180)

## 2019-12-12 LAB — TYPE AND SCREEN
ABO/RH(D): B POS
Antibody Screen: NEGATIVE
Unit division: 0

## 2019-12-12 LAB — BPAM RBC
Blood Product Expiration Date: 202107272359
ISSUE DATE / TIME: 202107061221
Unit Type and Rh: 7300

## 2019-12-12 LAB — OCCULT BLOOD X 1 CARD TO LAB, STOOL: Fecal Occult Bld: POSITIVE — AB

## 2019-12-12 LAB — POTASSIUM: Potassium: 5 mmol/L (ref 3.5–5.1)

## 2019-12-12 NOTE — Progress Notes (Signed)
Pulmonary Critical Care Medicine [place]   PULMONARY CRITICAL CARE SERVICE  PROGRESS NOTE  Date of Service: 12/12/2019  Shelley Nicholson  KZS:[id number]  DOB: 08/01/1950   DOA: 11/29/2019  Referring Physician: Merton Border, MD  HPI: Shelley Nicholson is a 69 y.o. female seen for follow up of Acute on Chronic Respiratory Failure.  Patient currently is on assist control mode has not been tolerating weaning.  Scheduled for possible tracheostomy hopefully it will be this week waiting on ENT  Medications: Reviewed on Rounds  Physical Exam:  Vitals: Temperature 97.1 pulse 95 respiratory rate 31 blood pressure is 161/86 saturations 97%  Ventilator Settings on assist control FiO2 35% tidal volume 657 PEEP 5  . General: Comfortable at this time . Eyes: Grossly normal lids, irises & conjunctiva . ENT: grossly tongue is normal . Neck: no obvious mass . Cardiovascular: S1 S2 normal no gallop . Respiratory: No rhonchi coarse breath sounds are noted at this time . Abdomen: soft . Skin: no rash seen on limited exam . Musculoskeletal: not rigid . Psychiatric:unable to assess . Neurologic: no seizure no involuntary movements         Lab Data:   Basic Metabolic Panel: Recent Labs  Lab 12/06/19 0957 12/11/19 0501 12/12/19 0016  NA  --  140  --   K 4.7 5.3* 5.0  CL  --  95*  --   CO2  --  34*  --   GLUCOSE  --  185*  --   BUN  --  102*  --   CREATININE  --  0.88  --   CALCIUM  --  8.4*  --     ABG: Recent Labs  Lab 12/06/19 0153 12/06/19 0504 12/06/19 1440 12/06/19 1758  PHART 7.211* 7.284* 7.309* 7.412  PCO2ART 112* 91.5* 87.9* 64.8*  PO2ART 76.1* 102 73.0* 59.2*  HCO3 43.4* 42.0* 42.7* 40.7*  O2SAT 93.1 97.2 93.7 92.4    Liver Function Tests: No results for input(s): AST, ALT, ALKPHOS, BILITOT, PROT, ALBUMIN in the last 168 hours. No results for input(s): LIPASE, AMYLASE in the last 168 hours. No results for input(s): AMMONIA in the last 168  hours.  CBC: Recent Labs  Lab 12/09/19 0501 12/11/19 0501  WBC 21.0* 17.0*  NEUTROABS 19.6*  --   HGB 7.4* 6.8*  HCT 24.9* 22.8*  MCV 66.8* 67.1*  PLT 259 281    Cardiac Enzymes: No results for input(s): CKTOTAL, CKMB, CKMBINDEX, TROPONINI in the last 168 hours.  BNP (last 3 results) No results for input(s): BNP in the last 8760 hours.  ProBNP (last 3 results) No results for input(s): PROBNP in the last 8760 hours.  Radiological Exams: No results found.  Assessment/Plan Active Problems:   Acute on chronic respiratory failure with hypoxia (HCC)   COPD, severe (HCC)   Lobar pneumonia, unspecified organism ([place])   Ventilator dependence ([place])   1. Acute on chronic respiratory failure hypoxia plan is to continue with on full support on the ventilator is not tolerating weaning right now hopefully once the tracheostomy is done we will be able to advance more aggressively. 2. Severe COPD at baseline we will continue with supportive care medical management 3. Lobar pneumonia treated clinically is improving 4. Ventilator dependence right now has oral intubation awaiting trach   I have personally seen and evaluated the patient, evaluated laboratory and imaging results, formulated the assessment and plan and placed orders. The Patient requires high complexity decision making with multiple systems involvement.  Rounds were done with the Respiratory Therapy Director and Staff therapists and discussed with nursing staff also.  Allyne Gee, MD [facility] Pulmonary Critical Care Medicine Sleep Medicine

## 2019-12-13 DIAGNOSIS — J181 Lobar pneumonia, unspecified organism: Secondary | ICD-10-CM | POA: Diagnosis not present

## 2019-12-13 DIAGNOSIS — Z9911 Dependence on respirator [ventilator] status: Secondary | ICD-10-CM | POA: Diagnosis not present

## 2019-12-13 DIAGNOSIS — J9621 Acute and chronic respiratory failure with hypoxia: Secondary | ICD-10-CM | POA: Diagnosis not present

## 2019-12-13 DIAGNOSIS — J449 Chronic obstructive pulmonary disease, unspecified: Secondary | ICD-10-CM | POA: Diagnosis not present

## 2019-12-13 LAB — BASIC METABOLIC PANEL
Anion gap: 8 (ref 5–15)
BUN: 124 mg/dL — ABNORMAL HIGH (ref 8–23)
CO2: 35 mmol/L — ABNORMAL HIGH (ref 22–32)
Calcium: 8.3 mg/dL — ABNORMAL LOW (ref 8.9–10.3)
Chloride: 96 mmol/L — ABNORMAL LOW (ref 98–111)
Creatinine, Ser: 1.19 mg/dL — ABNORMAL HIGH (ref 0.44–1.00)
GFR calc Af Amer: 54 mL/min — ABNORMAL LOW (ref >60)
GFR calc non Af Amer: 46 mL/min — ABNORMAL LOW (ref >60)
Glucose, Bld: 280 mg/dL — ABNORMAL HIGH (ref 70–99)
Potassium: 5.6 mmol/L — ABNORMAL HIGH (ref 3.5–5.1)
Sodium: 139 mmol/L (ref 135–145)

## 2019-12-13 LAB — CBC
HCT: 26.5 % — ABNORMAL LOW (ref 36.0–46.0)
Hemoglobin: 8.4 g/dL — ABNORMAL LOW (ref 12.0–15.0)
MCH: 22.5 pg — ABNORMAL LOW (ref 26.0–34.0)
MCHC: 31.7 g/dL (ref 30.0–36.0)
MCV: 70.9 fL — ABNORMAL LOW (ref 80.0–100.0)
Platelets: 237 10*3/uL (ref 150–400)
RBC: 3.74 MIL/uL — ABNORMAL LOW (ref 3.87–5.11)
RDW: 23.9 % — ABNORMAL HIGH (ref 11.5–15.5)
WBC: 16.9 10*3/uL — ABNORMAL HIGH (ref 4.0–10.5)
nRBC: 1.1 % — ABNORMAL HIGH (ref 0.0–0.2)

## 2019-12-13 LAB — SARS CORONAVIRUS 2 (TAT 6-24 HRS): SARS Coronavirus 2: NEGATIVE

## 2019-12-13 NOTE — Progress Notes (Signed)
Pulmonary Critical Care Medicine [place]   PULMONARY CRITICAL CARE SERVICE  PROGRESS NOTE  Date of Service: 12/13/2019  Shelley Nicholson  MPN:[id number]  DOB: [birth year]   DOA: 11/29/2019  Referring Physician: Merton Border, MD  HPI: Shelley Nicholson is a 69 y.o. female seen for follow up of Acute on Chronic Respiratory Failure.  Patient is on full support on assist control mode currently on 30% FiO2 has not been tolerating weaning.  Patient is scheduled for tracheostomy now  Medications: Reviewed on Rounds  Physical Exam:  Vitals: Temperature is 98.0 pulse 85 respiratory rate 24 blood pressure is 135/70 saturations 96%  Ventilator Settings on assist control FiO2 30% tidal volume 350 PEEP 5   General: Comfortable at this time  Eyes: Grossly normal lids, irises & conjunctiva  ENT: grossly tongue is normal  Neck: no obvious mass  Cardiovascular: S1 S2 normal no gallop  Respiratory: No rhonchi coarse breath sounds are noted at this time  Abdomen: soft  Skin: no rash seen on limited exam  Musculoskeletal: not rigid  Psychiatric:unable to assess  Neurologic: no seizure no involuntary movements         Lab Data:   Basic Metabolic Panel: Recent Labs  Lab 12/06/19 0957 12/11/19 0501 12/12/19 0016  NA  --  140  --   K 4.7 5.3* 5.0  CL  --  95*  --   CO2  --  34*  --   GLUCOSE  --  185*  --   BUN  --  102*  --   CREATININE  --  0.88  --   CALCIUM  --  8.4*  --     ABG: Recent Labs  Lab 12/06/19 1440 12/06/19 1758  PHART 7.309* 7.412  PCO2ART 87.9* 64.8*  PO2ART 73.0* 59.2*  HCO3 42.7* 40.7*  O2SAT 93.7 92.4    Liver Function Tests: No results for input(s): AST, ALT, ALKPHOS, BILITOT, PROT, ALBUMIN in the last 168 hours. No results for input(s): LIPASE, AMYLASE in the last 168 hours. No results for input(s): AMMONIA in the last 168 hours.  CBC: Recent Labs  Lab 12/09/19 0501 12/11/19 0501 12/12/19 1148  WBC 21.0*  17.0* 18.8*  NEUTROABS 19.6*  --   --   HGB 7.4* 6.8* 9.6*  HCT 24.9* 22.8* 31.0*  MCV 66.8* 67.1* 70.6*  PLT 259 281 251    Cardiac Enzymes: No results for input(s): CKTOTAL, CKMB, CKMBINDEX, TROPONINI in the last 168 hours.  BNP (last 3 results) No results for input(s): BNP in the last 8760 hours.  ProBNP (last 3 results) No results for input(s): PROBNP in the last 8760 hours.  Radiological Exams: No results found.  Assessment/Plan Active Problems:   Acute on chronic respiratory failure with hypoxia (HCC)   COPD, severe (HCC)   Lobar pneumonia, unspecified organism ([place])   Ventilator dependence ([place])   1. Acute on chronic respiratory failure hypoxia we will continue full support on the ventilator.  Await tracheostomy once that is done hopefully will be able to do further weaning. 2. Severe COPD medical management 3. Lobar pneumonia treated we will continue to follow 4. Ventilator dependence no change continue with supportive care   I have personally seen and evaluated the patient, evaluated laboratory and imaging results, formulated the assessment and plan and placed orders. The Patient requires high complexity decision making with multiple systems involvement.  Rounds were done with the Respiratory Therapy Director and Staff therapists and discussed with nursing staff also.  Allyne Gee, MD [facility] Pulmonary Critical Care Medicine Sleep Medicine

## 2019-12-14 DIAGNOSIS — Z9911 Dependence on respirator [ventilator] status: Secondary | ICD-10-CM | POA: Diagnosis not present

## 2019-12-14 DIAGNOSIS — J449 Chronic obstructive pulmonary disease, unspecified: Secondary | ICD-10-CM | POA: Diagnosis not present

## 2019-12-14 DIAGNOSIS — J9621 Acute and chronic respiratory failure with hypoxia: Secondary | ICD-10-CM | POA: Diagnosis not present

## 2019-12-14 DIAGNOSIS — J181 Lobar pneumonia, unspecified organism: Secondary | ICD-10-CM | POA: Diagnosis not present

## 2019-12-14 LAB — CBC
HCT: 26.7 % — ABNORMAL LOW (ref 36.0–46.0)
Hemoglobin: 8.3 g/dL — ABNORMAL LOW (ref 12.0–15.0)
MCH: 21.8 pg — ABNORMAL LOW (ref 26.0–34.0)
MCHC: 31.1 g/dL (ref 30.0–36.0)
MCV: 70.3 fL — ABNORMAL LOW (ref 80.0–100.0)
Platelets: 159 10*3/uL (ref 150–400)
RBC: 3.8 MIL/uL — ABNORMAL LOW (ref 3.87–5.11)
RDW: 24 % — ABNORMAL HIGH (ref 11.5–15.5)
WBC: 15.2 10*3/uL — ABNORMAL HIGH (ref 4.0–10.5)
nRBC: 0.9 % — ABNORMAL HIGH (ref 0.0–0.2)

## 2019-12-14 LAB — BASIC METABOLIC PANEL
Anion gap: 15 (ref 5–15)
BUN: 133 mg/dL — ABNORMAL HIGH (ref 8–23)
CO2: 31 mmol/L (ref 22–32)
Calcium: 8.1 mg/dL — ABNORMAL LOW (ref 8.9–10.3)
Chloride: 95 mmol/L — ABNORMAL LOW (ref 98–111)
Creatinine, Ser: 1.28 mg/dL — ABNORMAL HIGH (ref 0.44–1.00)
GFR calc Af Amer: 49 mL/min — ABNORMAL LOW (ref >60)
GFR calc non Af Amer: 42 mL/min — ABNORMAL LOW (ref >60)
Glucose, Bld: 216 mg/dL — ABNORMAL HIGH (ref 70–99)
Potassium: 4.1 mmol/L (ref 3.5–5.1)
Sodium: 141 mmol/L (ref 135–145)

## 2019-12-14 NOTE — Progress Notes (Signed)
Pulmonary Critical Care Medicine [place]   PULMONARY CRITICAL CARE SERVICE  PROGRESS NOTE  Date of Service: 12/14/2019  Aideen Fenster  TKP:[id number]  DOB: 13-Aug-1950   DOA: 11/29/2019  Referring Physician: Merton Border, MD  HPI: Shelley Nicholson is a 69 y.o. female seen for follow up of Acute on Chronic Respiratory Failure. Patient currently is on the ventilator and full support is not tolerating any weaning. Has been requiring 35% FiO2 good saturations are noted at this time.  Medications: Reviewed on Rounds  Physical Exam:  Vitals: Temperature is 96.7 pulse 88 respiratory rate 22 blood pressure is 131/65 saturations 93%  Ventilator Settings on assist control FiO2 is 35% tidal volume 350 PEEP 5   General: Comfortable at this time  Eyes: Grossly normal lids, irises & conjunctiva  ENT: grossly tongue is normal  Neck: no obvious mass  Cardiovascular: S1 S2 normal no gallop  Respiratory: Scattered coarse breath sounds and rhonchi  Abdomen: soft  Skin: no rash seen on limited exam  Musculoskeletal: not rigid  Psychiatric:unable to assess  Neurologic: no seizure no involuntary movements         Lab Data:   Basic Metabolic Panel: Recent Labs  Lab 12/11/19 0501 12/12/19 0016 12/13/19 1016 12/14/19 1449  NA 140  --  139 141  K 5.3* 5.0 5.6* 4.1  CL 95*  --  96* 95*  CO2 34*  --  35* 31  GLUCOSE 185*  --  280* 216*  BUN 102*  --  124* 133*  CREATININE 0.88  --  1.19* 1.28*  CALCIUM 8.4*  --  8.3* 8.1*    ABG: No results for input(s): PHART, PCO2ART, PO2ART, HCO3, O2SAT in the last 168 hours.  Liver Function Tests: No results for input(s): AST, ALT, ALKPHOS, BILITOT, PROT, ALBUMIN in the last 168 hours. No results for input(s): LIPASE, AMYLASE in the last 168 hours. No results for input(s): AMMONIA in the last 168 hours.  CBC: Recent Labs  Lab 12/09/19 0501 12/11/19 0501 12/12/19 1148 12/13/19 1016 12/14/19 1449  WBC  21.0* 17.0* 18.8* 16.9* 15.2*  NEUTROABS 19.6*  --   --   --   --   HGB 7.4* 6.8* 9.6* 8.4* 8.3*  HCT 24.9* 22.8* 31.0* 26.5* 26.7*  MCV 66.8* 67.1* 70.6* 70.9* 70.3*  PLT 259 281 251 237 159    Cardiac Enzymes: No results for input(s): CKTOTAL, CKMB, CKMBINDEX, TROPONINI in the last 168 hours.  BNP (last 3 results) No results for input(s): BNP in the last 8760 hours.  ProBNP (last 3 results) No results for input(s): PROBNP in the last 8760 hours.  Radiological Exams: No results found.  Assessment/Plan Active Problems:   Acute on chronic respiratory failure with hypoxia (HCC)   COPD, severe (HCC)   Lobar pneumonia, unspecified organism ([place])   Ventilator dependence ([place])   1. Acute on chronic respiratory failure hypoxia plan is to continue with full support on the ventilator patient has been on assist control mode. Will titrate oxygen as tolerated. 2. Severe COPD at baseline we will continue to monitor 3. Lobar pneumonia treated clinically appears to be improving 4. Vent dependence patient still continues to fail weaning awaiting tracheostomy   I have personally seen and evaluated the patient, evaluated laboratory and imaging results, formulated the assessment and plan and placed orders. The Patient requires high complexity decision making with multiple systems involvement.  Rounds were done with the Respiratory Therapy Director and Staff therapists and discussed with nursing  staff also.  Allyne Gee, MD [facility] Pulmonary Critical Care Medicine Sleep Medicine

## 2019-12-15 DIAGNOSIS — J181 Lobar pneumonia, unspecified organism: Secondary | ICD-10-CM | POA: Diagnosis not present

## 2019-12-15 DIAGNOSIS — J9621 Acute and chronic respiratory failure with hypoxia: Secondary | ICD-10-CM | POA: Diagnosis not present

## 2019-12-15 DIAGNOSIS — Z9911 Dependence on respirator [ventilator] status: Secondary | ICD-10-CM | POA: Diagnosis not present

## 2019-12-15 DIAGNOSIS — J449 Chronic obstructive pulmonary disease, unspecified: Secondary | ICD-10-CM | POA: Diagnosis not present

## 2019-12-15 NOTE — Progress Notes (Addendum)
Pulmonary Critical Care Medicine [place]   PULMONARY CRITICAL CARE SERVICE  PROGRESS NOTE  Date of Service: 12/15/2019  Shelley Nicholson  ZLD:[id number]  DOB: 1950/08/25   DOA: 11/29/2019  Referring Physician: Merton Border, MD  HPI: Shelley Nicholson is a 69 y.o. female seen for follow up of Acute on Chronic Respiratory Failure.  Patient remains on full support on the ventilator at this time is unable to wean currently requiring 40% FiO2.  Medications: Reviewed on Rounds  Physical Exam:  Vitals: Pulse 80 respirations 24 BP 126/61 O2 sat 94% temp 96.8  Ventilator Settings ventilator mode AC VC rate of 24 tidal volume 350 PEEP of 5 and FiO2 of 40%  . General: Comfortable at this time . Eyes: Grossly normal lids, irises & conjunctiva . ENT: grossly tongue is normal . Neck: no obvious mass . Cardiovascular: S1 S2 normal no gallop . Respiratory: No rales or rhonchi noted . Abdomen: soft . Skin: no rash seen on limited exam . Musculoskeletal: not rigid . Psychiatric:unable to assess . Neurologic: no seizure no involuntary movements         Lab Data:   Basic Metabolic Panel: Recent Labs  Lab 12/11/19 0501 12/12/19 0016 12/13/19 1016 12/14/19 1449  NA 140  --  139 141  K 5.3* 5.0 5.6* 4.1  CL 95*  --  96* 95*  CO2 34*  --  35* 31  GLUCOSE 185*  --  280* 216*  BUN 102*  --  124* 133*  CREATININE 0.88  --  1.19* 1.28*  CALCIUM 8.4*  --  8.3* 8.1*    ABG: No results for input(s): PHART, PCO2ART, PO2ART, HCO3, O2SAT in the last 168 hours.  Liver Function Tests: No results for input(s): AST, ALT, ALKPHOS, BILITOT, PROT, ALBUMIN in the last 168 hours. No results for input(s): LIPASE, AMYLASE in the last 168 hours. No results for input(s): AMMONIA in the last 168 hours.  CBC: Recent Labs  Lab 12/09/19 0501 12/11/19 0501 12/12/19 1148 12/13/19 1016 12/14/19 1449  WBC 21.0* 17.0* 18.8* 16.9* 15.2*  NEUTROABS 19.6*  --   --   --   --    HGB 7.4* 6.8* 9.6* 8.4* 8.3*  HCT 24.9* 22.8* 31.0* 26.5* 26.7*  MCV 66.8* 67.1* 70.6* 70.9* 70.3*  PLT 259 281 251 237 159    Cardiac Enzymes: No results for input(s): CKTOTAL, CKMB, CKMBINDEX, TROPONINI in the last 168 hours.  BNP (last 3 results) No results for input(s): BNP in the last 8760 hours.  ProBNP (last 3 results) No results for input(s): PROBNP in the last 8760 hours.  Radiological Exams: No results found.  Assessment/Plan Active Problems:   Acute on chronic respiratory failure with hypoxia (HCC)   COPD, severe (HCC)   Lobar pneumonia, unspecified organism ([place])   Ventilator dependence ([place])   1. Acute on chronic respiratory failure hypoxia plan continue full support ventilator at this time unable to wean.  We will continue aggressive pulmonary toilet supportive measures. 2. Severe COPD at baseline we will continue to monitor 3. Lobar pneumonia treated clinically appears to be improving 4. Vent dependence patient still continues to fail weaning awaiting tracheostomy   I have personally seen and evaluated the patient, evaluated laboratory and imaging results, formulated the assessment and plan and placed orders. The Patient requires high complexity decision making with multiple systems involvement.  Rounds were done with the Respiratory Therapy Director and Staff therapists and discussed with nursing staff also.  Allyne Gee,  MD [facility] Pulmonary Critical Care Medicine Sleep Medicine

## 2019-12-16 ENCOUNTER — Other Ambulatory Visit (HOSPITAL_COMMUNITY): Payer: Medicare Other

## 2019-12-16 DIAGNOSIS — J449 Chronic obstructive pulmonary disease, unspecified: Secondary | ICD-10-CM | POA: Diagnosis not present

## 2019-12-16 DIAGNOSIS — Z9911 Dependence on respirator [ventilator] status: Secondary | ICD-10-CM | POA: Diagnosis not present

## 2019-12-16 DIAGNOSIS — J9621 Acute and chronic respiratory failure with hypoxia: Secondary | ICD-10-CM | POA: Diagnosis not present

## 2019-12-16 DIAGNOSIS — J181 Lobar pneumonia, unspecified organism: Secondary | ICD-10-CM | POA: Diagnosis not present

## 2019-12-16 LAB — BASIC METABOLIC PANEL
Anion gap: 16 — ABNORMAL HIGH (ref 5–15)
BUN: 147 mg/dL — ABNORMAL HIGH (ref 8–23)
CO2: 32 mmol/L (ref 22–32)
Calcium: 8.4 mg/dL — ABNORMAL LOW (ref 8.9–10.3)
Chloride: 89 mmol/L — ABNORMAL LOW (ref 98–111)
Creatinine, Ser: 1.37 mg/dL — ABNORMAL HIGH (ref 0.44–1.00)
GFR calc Af Amer: 45 mL/min — ABNORMAL LOW (ref >60)
GFR calc non Af Amer: 39 mL/min — ABNORMAL LOW (ref >60)
Glucose, Bld: 232 mg/dL — ABNORMAL HIGH (ref 70–99)
Potassium: 3.2 mmol/L — ABNORMAL LOW (ref 3.5–5.1)
Sodium: 137 mmol/L (ref 135–145)

## 2019-12-16 LAB — CBC
HCT: 25.5 % — ABNORMAL LOW (ref 36.0–46.0)
HCT: 30.6 % — ABNORMAL LOW (ref 36.0–46.0)
Hemoglobin: 7.9 g/dL — ABNORMAL LOW (ref 12.0–15.0)
Hemoglobin: 9.6 g/dL — ABNORMAL LOW (ref 12.0–15.0)
MCH: 22.2 pg — ABNORMAL LOW (ref 26.0–34.0)
MCH: 23.9 pg — ABNORMAL LOW (ref 26.0–34.0)
MCHC: 31 g/dL (ref 30.0–36.0)
MCHC: 31.4 g/dL (ref 30.0–36.0)
MCV: 71.6 fL — ABNORMAL LOW (ref 80.0–100.0)
MCV: 76.3 fL — ABNORMAL LOW (ref 80.0–100.0)
Platelets: 257 10*3/uL (ref 150–400)
Platelets: ADEQUATE 10*3/uL (ref 150–400)
RBC: 3.56 MIL/uL — ABNORMAL LOW (ref 3.87–5.11)
RBC: 4.01 MIL/uL (ref 3.87–5.11)
RDW: 24.7 % — ABNORMAL HIGH (ref 11.5–15.5)
WBC: 18.7 10*3/uL — ABNORMAL HIGH (ref 4.0–10.5)
WBC: 17.3 10*3/uL — ABNORMAL HIGH (ref 4.0–10.5)
nRBC: 0.9 % — ABNORMAL HIGH (ref 0.0–0.2)
nRBC: 1.2 % — ABNORMAL HIGH (ref 0.0–0.2)

## 2019-12-16 LAB — PREPARE RBC (CROSSMATCH)

## 2019-12-16 LAB — T4, FREE: Free T4: 0.64 ng/dL (ref 0.61–1.12)

## 2019-12-16 LAB — POTASSIUM: Potassium: 3.1 mmol/L — ABNORMAL LOW (ref 3.5–5.1)

## 2019-12-16 LAB — TSH: TSH: <0.01 u[IU]/mL — ABNORMAL LOW (ref 0.350–4.500)

## 2019-12-16 NOTE — Progress Notes (Signed)
Pulmonary Critical Care Medicine [place]   PULMONARY CRITICAL CARE SERVICE  PROGRESS NOTE  Date of Service: 12/16/2019  Kharisma Glasner  YNW:[id number]  DOB: 06/26/1950   DOA: 11/29/2019  Referring Physician: Merton Border, MD  HPI: Shelley Nicholson is a 69 y.o. female seen for follow up of Acute on Chronic Respiratory Failure.  Patient is on assist control currently on 50% FiO2 she is not been tolerating any attempting at weaning  Medications: Reviewed on Rounds  Physical Exam:  Vitals: Temperature is 97.4 pulse 74 respiratory 24 blood pressure is 136/70 saturations 97%  Ventilator Settings on assist control FiO2 50% tidal volume 384 PEEP 5  . General: Comfortable at this time . Eyes: Grossly normal lids, irises & conjunctiva . ENT: grossly tongue is normal . Neck: no obvious mass . Cardiovascular: S1 S2 normal no gallop . Respiratory: Coarse breath sounds few rhonchi . Abdomen: soft . Skin: no rash seen on limited exam . Musculoskeletal: not rigid . Psychiatric:unable to assess . Neurologic: no seizure no involuntary movements         Lab Data:   Basic Metabolic Panel: Recent Labs  Lab 12/11/19 0501 12/11/19 0501 12/12/19 0016 12/13/19 1016 12/14/19 1449 12/16/19 0847 12/16/19 1138  NA 140  --   --  139 141 137  --   K 5.3*   < > 5.0 5.6* 4.1 3.2* 3.1*  CL 95*  --   --  96* 95* 89*  --   CO2 34*  --   --  35* 31 32  --   GLUCOSE 185*  --   --  280* 216* 232*  --   BUN 102*  --   --  124* 133* 147*  --   CREATININE 0.88  --   --  1.19* 1.28* 1.37*  --   CALCIUM 8.4*  --   --  8.3* 8.1* 8.4*  --    < > = values in this interval not displayed.    ABG: No results for input(s): PHART, PCO2ART, PO2ART, HCO3, O2SAT in the last 168 hours.  Liver Function Tests: No results for input(s): AST, ALT, ALKPHOS, BILITOT, PROT, ALBUMIN in the last 168 hours. No results for input(s): LIPASE, AMYLASE in the last 168 hours. No results for  input(s): AMMONIA in the last 168 hours.  CBC: Recent Labs  Lab 12/11/19 0501 12/12/19 1148 12/13/19 1016 12/14/19 1449 12/16/19 0847  WBC 17.0* 18.8* 16.9* 15.2* 18.7*  HGB 6.8* 9.6* 8.4* 8.3* 7.9*  HCT 22.8* 31.0* 26.5* 26.7* 25.5*  MCV 67.1* 70.6* 70.9* 70.3* 71.6*  PLT 281 251 237 159 257    Cardiac Enzymes: No results for input(s): CKTOTAL, CKMB, CKMBINDEX, TROPONINI in the last 168 hours.  BNP (last 3 results) No results for input(s): BNP in the last 8760 hours.  ProBNP (last 3 results) No results for input(s): PROBNP in the last 8760 hours.  Radiological Exams: DG CHEST PORT 1 VIEW  Result Date: 12/16/2019 CLINICAL DATA:  Hypoxia EXAM: PORTABLE CHEST 1 VIEW COMPARISON:  December 08, 2019 and November 29, 2019 FINDINGS: Endotracheal tube tip is 2.6 cm above the carina. Nasogastric tube tip and side port are below the diaphragm. Central catheter tip is in the superior vena cava. No pneumothorax. There is bullous disease in the upper lobes. There is interstitial thickening in the lower lung regions, likely due to a combination of fibrosis and redistribution of blood flow to viable lung segments. There is no airspace opacity. Heart size  normal. Pulmonary vascular flex underlying bullous disease in the upper lobes. No adenopathy. No bone lesions. IMPRESSION: Tube and catheter positions as described without pneumothorax. Bullous disease in the upper lobes. Probable lower lung region fibrosis and redistribution of blood flow to viable lung segments. No frank edema or consolidation. Heart size normal. No adenopathy appreciable. Electronically Signed   By: Lowella Grip III M.D.   On: 12/16/2019 12:13    Assessment/Plan Active Problems:   Acute on chronic respiratory failure with hypoxia (HCC)   COPD, severe (HCC)   Lobar pneumonia, unspecified organism ([place])   Ventilator dependence ([place])   1. Acute on chronic respiratory failure hypoxia plan is to continue with full support on the  ventilator patient's not been tolerating any weaning attempts. 2. Severe COPD at baseline medical management 3. Lobar pneumonia treated clinically improving 4. Ventilator dependence no change no weaning right now   I have personally seen and evaluated the patient, evaluated laboratory and imaging results, formulated the assessment and plan and placed orders. The Patient requires high complexity decision making with multiple systems involvement.  Rounds were done with the Respiratory Therapy Director and Staff therapists and discussed with nursing staff also.  Allyne Gee, MD [facility] Pulmonary Critical Care Medicine Sleep Medicine

## 2019-12-17 ENCOUNTER — Inpatient Hospital Stay (HOSPITAL_COMMUNITY): Payer: Medicare Other

## 2019-12-17 ENCOUNTER — Encounter (HOSPITAL_COMMUNITY)
Admission: AD | Disposition: A | Discharge status: Other Acute Inpatient Hospital | Payer: Self-pay | Source: Ambulatory Visit | Attending: Pulmonary Disease

## 2019-12-17 ENCOUNTER — Inpatient Hospital Stay (HOSPITAL_COMMUNITY)
Admission: AD | Admit: 2019-12-17 | Discharge: 2019-12-18 | DRG: 208 | Disposition: A | Discharge status: Other Acute Inpatient Hospital | Payer: Medicare Other | Source: Ambulatory Visit | Attending: Pulmonary Disease | Admitting: Pulmonary Disease

## 2019-12-17 ENCOUNTER — Other Ambulatory Visit (HOSPITAL_COMMUNITY): Payer: Self-pay

## 2019-12-17 ENCOUNTER — Encounter (HOSPITAL_COMMUNITY): Payer: Medicare Other | Admitting: Certified Registered Nurse Anesthetist

## 2019-12-17 ENCOUNTER — Inpatient Hospital Stay
Admission: AD | Admit: 2019-12-17 | Discharge: 2019-12-17 | Disposition: A | Discharge status: Still In Hospital | Payer: Self-pay | Source: Ambulatory Visit

## 2019-12-17 ENCOUNTER — Inpatient Hospital Stay (HOSPITAL_COMMUNITY): Payer: Medicare Other | Admitting: Certified Registered Nurse Anesthetist

## 2019-12-17 ENCOUNTER — Encounter (HOSPITAL_COMMUNITY)
Admission: RE | Disposition: A | Discharge status: Other Acute Inpatient Hospital | Payer: Self-pay | Source: Home / Self Care

## 2019-12-17 ENCOUNTER — Encounter (HOSPITAL_COMMUNITY): Payer: Self-pay | Admitting: Pulmonary Disease

## 2019-12-17 ENCOUNTER — Other Ambulatory Visit (HOSPITAL_COMMUNITY): Payer: Medicare Other

## 2019-12-17 DIAGNOSIS — J9690 Respiratory failure, unspecified, unspecified whether with hypoxia or hypercapnia: Secondary | ICD-10-CM

## 2019-12-17 DIAGNOSIS — Z0189 Encounter for other specified special examinations: Secondary | ICD-10-CM

## 2019-12-17 DIAGNOSIS — D72829 Elevated white blood cell count, unspecified: Secondary | ICD-10-CM | POA: Diagnosis not present

## 2019-12-17 DIAGNOSIS — I10 Essential (primary) hypertension: Secondary | ICD-10-CM | POA: Diagnosis present

## 2019-12-17 DIAGNOSIS — I480 Paroxysmal atrial fibrillation: Secondary | ICD-10-CM | POA: Diagnosis present

## 2019-12-17 DIAGNOSIS — J982 Interstitial emphysema: Secondary | ICD-10-CM

## 2019-12-17 DIAGNOSIS — E440 Moderate protein-calorie malnutrition: Secondary | ICD-10-CM | POA: Diagnosis present

## 2019-12-17 DIAGNOSIS — J9621 Acute and chronic respiratory failure with hypoxia: Secondary | ICD-10-CM | POA: Diagnosis present

## 2019-12-17 DIAGNOSIS — J9622 Acute and chronic respiratory failure with hypercapnia: Secondary | ICD-10-CM | POA: Diagnosis present

## 2019-12-17 DIAGNOSIS — N179 Acute kidney failure, unspecified: Secondary | ICD-10-CM | POA: Diagnosis present

## 2019-12-17 DIAGNOSIS — Y848 Other medical procedures as the cause of abnormal reaction of the patient, or of later complication, without mention of misadventure at the time of the procedure: Secondary | ICD-10-CM | POA: Diagnosis not present

## 2019-12-17 DIAGNOSIS — F17210 Nicotine dependence, cigarettes, uncomplicated: Secondary | ICD-10-CM | POA: Diagnosis present

## 2019-12-17 DIAGNOSIS — Z431 Encounter for attention to gastrostomy: Secondary | ICD-10-CM

## 2019-12-17 DIAGNOSIS — J432 Centrilobular emphysema: Secondary | ICD-10-CM | POA: Diagnosis present

## 2019-12-17 DIAGNOSIS — R34 Anuria and oliguria: Secondary | ICD-10-CM | POA: Diagnosis present

## 2019-12-17 DIAGNOSIS — E669 Obesity, unspecified: Secondary | ICD-10-CM | POA: Diagnosis present

## 2019-12-17 DIAGNOSIS — J9509 Other tracheostomy complication: Principal | ICD-10-CM | POA: Diagnosis present

## 2019-12-17 DIAGNOSIS — Y838 Other surgical procedures as the cause of abnormal reaction of the patient, or of later complication, without mention of misadventure at the time of the procedure: Secondary | ICD-10-CM | POA: Diagnosis present

## 2019-12-17 DIAGNOSIS — Z6829 Body mass index (BMI) 29.0-29.9, adult: Secondary | ICD-10-CM

## 2019-12-17 DIAGNOSIS — J9382 Other air leak: Secondary | ICD-10-CM | POA: Diagnosis present

## 2019-12-17 DIAGNOSIS — R040 Epistaxis: Secondary | ICD-10-CM | POA: Diagnosis not present

## 2019-12-17 DIAGNOSIS — Z8701 Personal history of pneumonia (recurrent): Secondary | ICD-10-CM

## 2019-12-17 DIAGNOSIS — T797XXA Traumatic subcutaneous emphysema, initial encounter: Secondary | ICD-10-CM | POA: Diagnosis present

## 2019-12-17 DIAGNOSIS — R601 Generalized edema: Secondary | ICD-10-CM | POA: Diagnosis present

## 2019-12-17 DIAGNOSIS — Z9911 Dependence on respirator [ventilator] status: Secondary | ICD-10-CM

## 2019-12-17 DIAGNOSIS — E119 Type 2 diabetes mellitus without complications: Secondary | ICD-10-CM | POA: Diagnosis present

## 2019-12-17 DIAGNOSIS — L8990 Pressure ulcer of unspecified site, unspecified stage: Secondary | ICD-10-CM | POA: Insufficient documentation

## 2019-12-17 DIAGNOSIS — E049 Nontoxic goiter, unspecified: Secondary | ICD-10-CM | POA: Diagnosis present

## 2019-12-17 DIAGNOSIS — J9620 Acute and chronic respiratory failure, unspecified whether with hypoxia or hypercapnia: Secondary | ICD-10-CM

## 2019-12-17 HISTORY — PX: TRACHEOSTOMY TUBE PLACEMENT: SHX814

## 2019-12-17 LAB — BPAM RBC
Blood Product Expiration Date: 202108112359
ISSUE DATE / TIME: 202107111222
Unit Type and Rh: 7300

## 2019-12-17 LAB — CBC WITH DIFFERENTIAL/PLATELET
Abs Immature Granulocytes: 0.84 10*3/uL — ABNORMAL HIGH (ref 0.00–0.07)
Basophils Absolute: 0.1 10*3/uL (ref 0.0–0.1)
Basophils Relative: 0 %
Eosinophils Absolute: 0 10*3/uL (ref 0.0–0.5)
Eosinophils Relative: 0 %
HCT: 34 % — ABNORMAL LOW (ref 36.0–46.0)
Hemoglobin: 10.5 g/dL — ABNORMAL LOW (ref 12.0–15.0)
Immature Granulocytes: 5 %
Lymphocytes Relative: 1 %
Lymphs Abs: 0.3 10*3/uL — ABNORMAL LOW (ref 0.7–4.0)
MCH: 24 pg — ABNORMAL LOW (ref 26.0–34.0)
MCHC: 30.9 g/dL (ref 30.0–36.0)
MCV: 77.8 fL — ABNORMAL LOW (ref 80.0–100.0)
Monocytes Absolute: 0.6 10*3/uL (ref 0.1–1.0)
Monocytes Relative: 3 %
Neutro Abs: 16.9 10*3/uL — ABNORMAL HIGH (ref 1.7–7.7)
Neutrophils Relative %: 91 %
Platelets: 248 10*3/uL (ref 150–400)
RBC: 4.37 MIL/uL (ref 3.87–5.11)
WBC: 18.7 10*3/uL — ABNORMAL HIGH (ref 4.0–10.5)
nRBC: 0.6 % — ABNORMAL HIGH (ref 0.0–0.2)

## 2019-12-17 LAB — COMPREHENSIVE METABOLIC PANEL
ALT: 111 U/L — ABNORMAL HIGH (ref 0–44)
AST: 51 U/L — ABNORMAL HIGH (ref 15–41)
Albumin: 2.3 g/dL — ABNORMAL LOW (ref 3.5–5.0)
Alkaline Phosphatase: 97 U/L (ref 38–126)
Anion gap: 13 (ref 5–15)
BUN: 154 mg/dL — ABNORMAL HIGH (ref 8–23)
CO2: 31 mmol/L (ref 22–32)
Calcium: 8.2 mg/dL — ABNORMAL LOW (ref 8.9–10.3)
Chloride: 90 mmol/L — ABNORMAL LOW (ref 98–111)
Creatinine, Ser: 1.45 mg/dL — ABNORMAL HIGH (ref 0.44–1.00)
GFR calc Af Amer: 42 mL/min — ABNORMAL LOW (ref >60)
GFR calc non Af Amer: 36 mL/min — ABNORMAL LOW (ref >60)
Glucose, Bld: 155 mg/dL — ABNORMAL HIGH (ref 70–99)
Potassium: 3.8 mmol/L (ref 3.5–5.1)
Sodium: 134 mmol/L — ABNORMAL LOW (ref 135–145)
Total Bilirubin: 0.9 mg/dL (ref 0.3–1.2)
Total Protein: 4.9 g/dL — ABNORMAL LOW (ref 6.5–8.1)

## 2019-12-17 LAB — HIV ANTIBODY (ROUTINE TESTING W REFLEX): HIV Screen 4th Generation wRfx: NONREACTIVE

## 2019-12-17 LAB — POCT I-STAT 7, (LYTES, BLD GAS, ICA,H+H)
Acid-Base Excess: 8 mmol/L — ABNORMAL HIGH (ref 0.0–2.0)
Bicarbonate: 35.4 mmol/L — ABNORMAL HIGH (ref 20.0–28.0)
Calcium, Ion: 1.06 mmol/L — ABNORMAL LOW (ref 1.15–1.40)
HCT: 28 % — ABNORMAL LOW (ref 36.0–46.0)
Hemoglobin: 9.5 g/dL — ABNORMAL LOW (ref 12.0–15.0)
O2 Saturation: 100 %
Patient temperature: 34.4
Potassium: 3.3 mmol/L — ABNORMAL LOW (ref 3.5–5.1)
Sodium: 136 mmol/L (ref 135–145)
TCO2: 37 mmol/L — ABNORMAL HIGH (ref 22–32)
pCO2 arterial: 60.5 mmHg — ABNORMAL HIGH (ref 32.0–48.0)
pH, Arterial: 7.363 (ref 7.350–7.450)
pO2, Arterial: 324 mmHg — ABNORMAL HIGH (ref 83.0–108.0)

## 2019-12-17 LAB — TYPE AND SCREEN
ABO/RH(D): B POS
Antibody Screen: NEGATIVE
Unit division: 0

## 2019-12-17 LAB — PROTIME-INR
INR: 1 (ref 0.8–1.2)
Prothrombin Time: 13.1 seconds (ref 11.4–15.2)

## 2019-12-17 LAB — MRSA PCR SCREENING: MRSA by PCR: NEGATIVE

## 2019-12-17 LAB — GLUCOSE, CAPILLARY
Glucose-Capillary: 151 mg/dL — ABNORMAL HIGH (ref 70–99)
Glucose-Capillary: 143 mg/dL — ABNORMAL HIGH (ref 70–99)

## 2019-12-17 LAB — APTT: aPTT: 29 seconds (ref 24–36)

## 2019-12-17 SURGERY — CREATION, TRACHEOSTOMY
Anesthesia: General | Site: Neck

## 2019-12-17 MED ORDER — PHENYLEPHRINE HCL-NACL 10-0.9 MG/250ML-% IV SOLN
INTRAVENOUS | Status: DC | PRN
  Administered 2019-12-17: 25 ug/min via INTRAVENOUS

## 2019-12-17 MED ORDER — LACTATED RINGERS IV SOLN: INTRAVENOUS | Status: DC | PRN

## 2019-12-17 MED ORDER — ONDANSETRON HCL 4 MG/2ML IJ SOLN
INTRAMUSCULAR | Status: DC | PRN
  Administered 2019-12-17: 4 mg via INTRAVENOUS

## 2019-12-17 MED ORDER — HEPARIN SODIUM (PORCINE) 5000 UNIT/ML IJ SOLN
5000.0000 [IU] | Freq: Three times a day (TID) | INTRAMUSCULAR | Status: DC
  Administered 2019-12-18: 5000 [IU] via SUBCUTANEOUS
  Filled 2019-12-17: qty 1

## 2019-12-17 MED ORDER — ORAL CARE MOUTH RINSE
15.0000 mL | OROMUCOSAL | Status: DC
  Administered 2019-12-17 – 2019-12-18 (×7): 15 mL via OROMUCOSAL

## 2019-12-17 MED ORDER — FENTANYL CITRATE (PF) 100 MCG/2ML IJ SOLN
25.0000 ug | Freq: Once | INTRAMUSCULAR | Status: DC
Start: 2019-12-17 — End: 2019-12-17

## 2019-12-17 MED ORDER — MIDAZOLAM HCL 2 MG/2ML IJ SOLN
INTRAMUSCULAR | Status: DC | PRN
  Administered 2019-12-17: 2 mg via INTRAVENOUS

## 2019-12-17 MED ORDER — FENTANYL CITRATE (PF) 250 MCG/5ML IJ SOLN
INTRAMUSCULAR | Status: DC | PRN
  Administered 2019-12-17 (×5): 50 ug via INTRAVENOUS

## 2019-12-17 MED ORDER — LIDOCAINE 2% (20 MG/ML) 5 ML SYRINGE
INTRAMUSCULAR | Status: AC
  Filled 2019-12-17: qty 5

## 2019-12-17 MED ORDER — ALBUTEROL SULFATE HFA 108 (90 BASE) MCG/ACT IN AERS
INHALATION_SPRAY | RESPIRATORY_TRACT | Status: DC | PRN
  Administered 2019-12-17: 4 via RESPIRATORY_TRACT

## 2019-12-17 MED ORDER — PROPOFOL 10 MG/ML IV BOLUS
INTRAVENOUS | Status: AC
  Filled 2019-12-17: qty 20

## 2019-12-17 MED ORDER — FENTANYL 2500MCG IN NS 250ML (10MCG/ML) PREMIX INFUSION
25.0000 ug/h | INTRAVENOUS | Status: DC
  Administered 2019-12-17: 25 ug/h via INTRAVENOUS
  Filled 2019-12-17: qty 250

## 2019-12-17 MED ORDER — LIDOCAINE-EPINEPHRINE 1 %-1:100000 IJ SOLN
INTRAMUSCULAR | Status: DC | PRN
  Administered 2019-12-17: 4 mL

## 2019-12-17 MED ORDER — DEXAMETHASONE SODIUM PHOSPHATE 10 MG/ML IJ SOLN
INTRAMUSCULAR | Status: AC
  Filled 2019-12-17: qty 1

## 2019-12-17 MED ORDER — DOCUSATE SODIUM 100 MG PO CAPS: 100.0000 mg | ORAL_CAPSULE | Freq: Two times a day (BID) | ORAL | Status: DC | PRN

## 2019-12-17 MED ORDER — POLYETHYLENE GLYCOL 3350 17 G PO PACK: 17.0000 g | PACK | Freq: Every day | ORAL | Status: DC | PRN

## 2019-12-17 MED ORDER — VECURONIUM BROMIDE 10 MG IV SOLR: 10.0000 mg | INTRAVENOUS | Status: DC | PRN

## 2019-12-17 MED ORDER — FENTANYL CITRATE (PF) 250 MCG/5ML IJ SOLN
INTRAMUSCULAR | Status: AC
  Filled 2019-12-17: qty 5

## 2019-12-17 MED ORDER — MIDAZOLAM HCL 2 MG/2ML IJ SOLN
INTRAMUSCULAR | Status: AC
  Filled 2019-12-17: qty 2

## 2019-12-17 MED ORDER — SODIUM CHLORIDE 0.9 % IV SOLN: 25.0000 ug/h | INTRAVENOUS | Status: DC

## 2019-12-17 MED ORDER — MIDAZOLAM HCL 5 MG/5ML IJ SOLN
INTRAMUSCULAR | Status: DC | PRN
  Administered 2019-12-17 (×2): 1 mg via INTRAVENOUS

## 2019-12-17 MED ORDER — PHENYLEPHRINE HCL-NACL 10-0.9 MG/250ML-% IV SOLN
INTRAVENOUS | Status: DC | PRN
Start: 2019-12-17 — End: 2019-12-17
  Administered 2019-12-17: 25 ug/min via INTRAVENOUS

## 2019-12-17 MED ORDER — SODIUM CHLORIDE 0.9 % IV SOLN: 250.0000 mL | INTRAVENOUS | Status: DC

## 2019-12-17 MED ORDER — NOREPINEPHRINE 4 MG/250ML-% IV SOLN
5.0000 ug/min | INTRAVENOUS | Status: DC
  Administered 2019-12-17: 5 ug/min via INTRAVENOUS
  Filled 2019-12-17: qty 250

## 2019-12-17 MED ORDER — PHENYLEPHRINE 40 MCG/ML (10ML) SYRINGE FOR IV PUSH (FOR BLOOD PRESSURE SUPPORT)
PREFILLED_SYRINGE | INTRAVENOUS | Status: DC | PRN
  Administered 2019-12-17: 160 ug via INTRAVENOUS
  Administered 2019-12-17: 80 ug via INTRAVENOUS

## 2019-12-17 MED ORDER — 0.9 % SODIUM CHLORIDE (POUR BTL) OPTIME
TOPICAL | Status: DC | PRN
  Administered 2019-12-17: 1000 mL

## 2019-12-17 MED ORDER — CHLORHEXIDINE GLUCONATE 0.12% ORAL RINSE (MEDLINE KIT)
15.0000 mL | Freq: Two times a day (BID) | OROMUCOSAL | Status: DC
  Administered 2019-12-18: 15 mL via OROMUCOSAL

## 2019-12-17 MED ORDER — FENTANYL CITRATE (PF) 100 MCG/2ML IJ SOLN: 25.0000 ug | Freq: Once | INTRAMUSCULAR | Status: DC

## 2019-12-17 MED ORDER — INSULIN ASPART 100 UNIT/ML ~~LOC~~ SOLN
0.0000 [IU] | SUBCUTANEOUS | Status: DC
  Administered 2019-12-17: 2 [IU] via SUBCUTANEOUS
  Administered 2019-12-17: 3 [IU] via SUBCUTANEOUS

## 2019-12-17 MED ORDER — CEFAZOLIN SODIUM-DEXTROSE 1-4 GM/50ML-% IV SOLN
1.0000 g | Freq: Two times a day (BID) | INTRAVENOUS | Status: DC
  Administered 2019-12-18: 1 g via INTRAVENOUS
  Filled 2019-12-17: qty 50

## 2019-12-17 MED ORDER — CEFAZOLIN SODIUM-DEXTROSE 2-3 GM-%(50ML) IV SOLR
INTRAVENOUS | Status: DC | PRN
  Administered 2019-12-17: 2 g via INTRAVENOUS

## 2019-12-17 MED ORDER — POLYETHYLENE GLYCOL 3350 17 G PO PACK: 17.0000 g | PACK | Freq: Every day | ORAL | Status: DC

## 2019-12-17 MED ORDER — IOHEXOL 300 MG/ML  SOLN
75.0000 mL | Freq: Once | INTRAMUSCULAR | Status: AC | PRN
  Administered 2019-12-17: 75 mL via INTRAVENOUS

## 2019-12-17 MED ORDER — PHENYLEPHRINE 40 MCG/ML (10ML) SYRINGE FOR IV PUSH (FOR BLOOD PRESSURE SUPPORT)
PREFILLED_SYRINGE | INTRAVENOUS | Status: DC | PRN
  Administered 2019-12-17: 40 ug via INTRAVENOUS
  Administered 2019-12-17: 80 ug via INTRAVENOUS

## 2019-12-17 MED ORDER — DOCUSATE SODIUM 50 MG/5ML PO LIQD: 100.0000 mg | Freq: Two times a day (BID) | ORAL | Status: DC

## 2019-12-17 MED ORDER — VECURONIUM BROMIDE 10 MG IV SOLR
INTRAVENOUS | Status: AC
  Filled 2019-12-17: qty 10

## 2019-12-17 MED ORDER — PHENYLEPHRINE 40 MCG/ML (10ML) SYRINGE FOR IV PUSH (FOR BLOOD PRESSURE SUPPORT)
PREFILLED_SYRINGE | INTRAVENOUS | Status: AC
  Filled 2019-12-17: qty 20

## 2019-12-17 MED ORDER — ROCURONIUM BROMIDE 10 MG/ML (PF) SYRINGE
PREFILLED_SYRINGE | INTRAVENOUS | Status: DC | PRN
  Administered 2019-12-17: 40 mg via INTRAVENOUS
  Administered 2019-12-17: 60 mg via INTRAVENOUS

## 2019-12-17 MED ORDER — ROCURONIUM BROMIDE 10 MG/ML (PF) SYRINGE
PREFILLED_SYRINGE | INTRAVENOUS | Status: DC | PRN
  Administered 2019-12-17: 50 mg via INTRAVENOUS

## 2019-12-17 MED ORDER — PROPOFOL 1000 MG/100ML IV EMUL
0.0000 ug/kg/min | INTRAVENOUS | Status: DC
  Administered 2019-12-17: 10 ug/kg/min via INTRAVENOUS
  Administered 2019-12-18: 20 ug/kg/min via INTRAVENOUS
  Filled 2019-12-17: qty 100

## 2019-12-17 MED ORDER — DEXAMETHASONE SODIUM PHOSPHATE 10 MG/ML IJ SOLN
INTRAMUSCULAR | Status: DC | PRN
  Administered 2019-12-17: 10 mg via INTRAVENOUS

## 2019-12-17 MED ORDER — FAMOTIDINE IN NACL 20-0.9 MG/50ML-% IV SOLN
20.0000 mg | INTRAVENOUS | Status: DC
  Administered 2019-12-17: 20 mg via INTRAVENOUS
  Filled 2019-12-17: qty 50

## 2019-12-17 MED ORDER — FENTANYL BOLUS VIA INFUSION
25.0000 ug | INTRAVENOUS | Status: DC | PRN
  Filled 2019-12-17: qty 25

## 2019-12-17 MED ORDER — PROPOFOL 1000 MG/100ML IV EMUL
0.0000 ug/kg/min | INTRAVENOUS | Status: DC
Start: 2019-12-17 — End: 2019-12-17

## 2019-12-17 MED ORDER — ONDANSETRON HCL 4 MG/2ML IJ SOLN
INTRAMUSCULAR | Status: AC
  Filled 2019-12-17: qty 2

## 2019-12-17 MED ORDER — FENTANYL BOLUS VIA INFUSION
25.0000 ug | INTRAVENOUS | Status: DC | PRN
Start: 2019-12-17 — End: 2019-12-17

## 2019-12-17 MED ORDER — CEFAZOLIN SODIUM-DEXTROSE 2-3 GM-%(50ML) IV SOLR
INTRAVENOUS | Status: DC | PRN
Start: 2019-12-17 — End: 2019-12-17
  Administered 2019-12-17: 2 g via INTRAVENOUS

## 2019-12-17 MED ORDER — FAMOTIDINE IN NACL 20-0.9 MG/50ML-% IV SOLN: 20.0000 mg | Freq: Two times a day (BID) | INTRAVENOUS | Status: DC

## 2019-12-17 SURGICAL SUPPLY — 45 items
ATTRACTOMAT 16X20 MAGNETIC DRP (DRAPES) IMPLANT
BLADE SURG 15 STRL LF DISP TIS (BLADE) ×1 IMPLANT
BLADE SURG 15 STRL SS (BLADE) ×2
CLEANER TIP ELECTROSURG 2X2 (MISCELLANEOUS) ×3 IMPLANT
COVER SURGICAL LIGHT HANDLE (MISCELLANEOUS) ×3 IMPLANT
COVER WAND RF STERILE (DRAPES) ×3 IMPLANT
DRAPE HALF SHEET 40X57 (DRAPES) IMPLANT
ELECT COATED BLADE 2.86 ST (ELECTRODE) ×3 IMPLANT
ELECT REM PT RETURN 9FT ADLT (ELECTROSURGICAL) ×3
ELECTRODE REM PT RTRN 9FT ADLT (ELECTROSURGICAL) ×1 IMPLANT
GAUZE 4X4 16PLY RFD (DISPOSABLE) ×3 IMPLANT
GEL ULTRASOUND 20GR AQUASONIC (MISCELLANEOUS) ×3 IMPLANT
GLOVE BIOGEL PI IND STRL 7.0 (GLOVE) ×1 IMPLANT
GLOVE BIOGEL PI INDICATOR 7.0 (GLOVE) ×2
GLOVE SS BIOGEL STRL SZ 7.5 (GLOVE) ×1 IMPLANT
GLOVE SUPERSENSE BIOGEL SZ 7.5 (GLOVE) ×2
GOWN STRL REUS W/ TWL LRG LVL3 (GOWN DISPOSABLE) ×1 IMPLANT
GOWN STRL REUS W/ TWL XL LVL3 (GOWN DISPOSABLE) ×1 IMPLANT
GOWN STRL REUS W/TWL LRG LVL3 (GOWN DISPOSABLE) ×2
GOWN STRL REUS W/TWL XL LVL3 (GOWN DISPOSABLE) ×2
HOLDER TRACH TUBE VELCRO 19.5 (MISCELLANEOUS) ×3 IMPLANT
KIT BASIN OR (CUSTOM PROCEDURE TRAY) ×3 IMPLANT
KIT SUCTION CATH 14FR (SUCTIONS) ×3 IMPLANT
KIT TURNOVER KIT B (KITS) ×3 IMPLANT
NEEDLE HYPO 25GX1X1/2 BEV (NEEDLE) IMPLANT
NS IRRIG 1000ML POUR BTL (IV SOLUTION) ×3 IMPLANT
PACK EENT II TURBAN DRAPE (CUSTOM PROCEDURE TRAY) ×3 IMPLANT
PAD ARMBOARD 7.5X6 YLW CONV (MISCELLANEOUS) IMPLANT
PENCIL BUTTON HOLSTER BLD 10FT (ELECTRODE) ×3 IMPLANT
SPONGE DRAIN TRACH 4X4 STRL 2S (GAUZE/BANDAGES/DRESSINGS) ×3 IMPLANT
SPONGE INTESTINAL PEANUT (DISPOSABLE) IMPLANT
SUT SILK 2 0 SH CR/8 (SUTURE) ×3 IMPLANT
SUT SILK 3 0 TIES 10X30 (SUTURE) IMPLANT
SYR 10ML LL (SYRINGE) ×3 IMPLANT
SYR 5ML LUER SLIP (SYRINGE) ×3 IMPLANT
SYR CONTROL 10ML LL (SYRINGE) ×3 IMPLANT
TOWEL GREEN STERILE (TOWEL DISPOSABLE) ×3 IMPLANT
TOWEL GREEN STERILE FF (TOWEL DISPOSABLE) ×3 IMPLANT
TUBE CONNECTING 12'X1/4 (SUCTIONS) ×1
TUBE CONNECTING 12X1/4 (SUCTIONS) ×2 IMPLANT
TUBE TRACH 6.0 EXL PROX CUF (TUBING) IMPLANT
TUBE TRACH ADJ TTS 7MM (TUBING) ×3 IMPLANT
TUBE TRACH SHILEY  6 DIST  CUF (TUBING) IMPLANT
TUBE TRACH SHILEY 6 UNCF (TUBING) ×3 IMPLANT
TUBE TRACH SHILEY 8 DIST CUF (TUBING) IMPLANT

## 2019-12-17 SURGICAL SUPPLY — 43 items
ATTRACTOMAT 16X20 MAGNETIC DRP (DRAPES) IMPLANT
BLADE SURG 15 STRL LF DISP TIS (BLADE) IMPLANT
BLADE SURG 15 STRL SS (BLADE)
CLEANER TIP ELECTROSURG 2X2 (MISCELLANEOUS) ×3 IMPLANT
CORD BIPOLAR FORCEPS 12FT (ELECTRODE) ×3 IMPLANT
COVER SURGICAL LIGHT HANDLE (MISCELLANEOUS) ×3 IMPLANT
COVER WAND RF STERILE (DRAPES) IMPLANT
DRAPE HALF SHEET 40X57 (DRAPES) ×3 IMPLANT
ELECT COATED BLADE 2.86 ST (ELECTRODE) ×3 IMPLANT
ELECT REM PT RETURN 9FT ADLT (ELECTROSURGICAL) ×3
ELECTRODE REM PT RTRN 9FT ADLT (ELECTROSURGICAL) ×1 IMPLANT
FORCEPS BIPOLAR SPETZLER 8 1.0 (NEUROSURGERY SUPPLIES) ×3 IMPLANT
GAUZE 4X4 16PLY RFD (DISPOSABLE) IMPLANT
GEL ULTRASOUND 20GR AQUASONIC (MISCELLANEOUS) IMPLANT
GLOVE SS BIOGEL STRL SZ 7.5 (GLOVE) ×1 IMPLANT
GLOVE SUPERSENSE BIOGEL SZ 7.5 (GLOVE) ×2
GOWN STRL REUS W/ TWL LRG LVL3 (GOWN DISPOSABLE) ×3 IMPLANT
GOWN STRL REUS W/ TWL XL LVL3 (GOWN DISPOSABLE) ×1 IMPLANT
GOWN STRL REUS W/TWL LRG LVL3 (GOWN DISPOSABLE) ×6
GOWN STRL REUS W/TWL XL LVL3 (GOWN DISPOSABLE) ×2
HOLDER TRACH TUBE VELCRO 19.5 (MISCELLANEOUS) ×3 IMPLANT
KIT BASIN OR (CUSTOM PROCEDURE TRAY) ×3 IMPLANT
KIT SUCTION CATH 14FR (SUCTIONS) ×3 IMPLANT
KIT TURNOVER KIT B (KITS) ×3 IMPLANT
NEEDLE HYPO 25GX1X1/2 BEV (NEEDLE) ×3 IMPLANT
NS IRRIG 1000ML POUR BTL (IV SOLUTION) ×3 IMPLANT
PACK EENT II TURBAN DRAPE (CUSTOM PROCEDURE TRAY) ×3 IMPLANT
PAD ARMBOARD 7.5X6 YLW CONV (MISCELLANEOUS) IMPLANT
PENCIL BUTTON HOLSTER BLD 10FT (ELECTRODE) IMPLANT
PENCIL SMOKE EVACUATOR (MISCELLANEOUS) ×3 IMPLANT
SPONGE DRAIN TRACH 4X4 STRL 2S (GAUZE/BANDAGES/DRESSINGS) IMPLANT
SPONGE INTESTINAL PEANUT (DISPOSABLE) ×3 IMPLANT
SUT SILK 2 0 SH CR/8 (SUTURE) ×3 IMPLANT
SUT SILK 3 0 TIES 10X30 (SUTURE) ×3 IMPLANT
SYR 5ML LUER SLIP (SYRINGE) IMPLANT
SYR CONTROL 10ML LL (SYRINGE) ×3 IMPLANT
TOWEL GREEN STERILE (TOWEL DISPOSABLE) ×3 IMPLANT
TOWEL GREEN STERILE FF (TOWEL DISPOSABLE) ×3 IMPLANT
TUBE CONNECTING 12'X1/4 (SUCTIONS) ×1
TUBE CONNECTING 12X1/4 (SUCTIONS) ×2 IMPLANT
TUBE TRACH 6.0 EXL PROX CUF (TUBING) ×3 IMPLANT
TUBE TRACH SHILEY  6 DIST  CUF (TUBING) IMPLANT
TUBE TRACH SHILEY 8 DIST CUF (TUBING) IMPLANT

## 2019-12-17 NOTE — H&P (Signed)
NAMEEvanee Nicholson, MRN:  [id number], DOB:  1950/12/13, LOS: 0 ADMISSION DATE:  12/17/2019, CONSULTATION DATE:  12/17/19 REFERRING MD:  Lucia Gaskins, CHIEF COMPLAINT:  Chronic respiratory failure  Brief History   11 yof with COPD and a goiter who was initially intubated 6/17 for a COPD exacerbation and has been unable to be weaned off the ventilator since. Underwent tracheostomy 12/17/19 by ENT. Follow up imaging showing subcutaneous emphysema and PCCM consulted for ongoing management.  History of present illness   35 yof with COPD, goiter, HTN, paroxysmal afib who initially was admitted to [facility] critical care unit for severe COPD exacerbation on 6/17. She ultimately required intubation on the day of admission. For an unclear reason, her chart indicates she was extubated that same day however required re-intubation due to progressive respiratory failure. Sputum cultures were + for haemophilus which was treated with rocephin. She was unable to be weaned off the vent and was ultimately discharged to select specialty.   Past Medical History  COPD, goiter, HTN, paroxysmal afib   Significant Hospital Events   7/12 tracheostomy, admission  Consults:  ENT  Procedures:  7/12 tracheostomy XLT 60  Significant Diagnostic Tests:  7/12 CXR>>new extensive subcutaneous emphysema. Small amount of gas along the left heart boarder that could reflect small medial pneumothorax or penumomediastinum. Bibasilar atelectasis or infiltrates. 7/12 abd xray>>subcut gas seen extending down the right lateral abdominal wall  7/12 CT chest>>extensive pneumomediastinum and severe, very extensive subcutaneous emphysema about the chest, neck, and upper abdomen. No pneumothorax. Clustered heterogenous airspace opacities in the bilateral lung spaces consistent with aspiration. Severe emphysema  Micro Data:  7/11 tracheal aspirate>>NGTD  Antimicrobials:  N/A  Interim history/subjective:  See above  Objective     Weight 50.3 kg, SpO2 100 %.    Vent Mode: PRVC FiO2 (%):  [100 %] 100 % Set Rate:  [28 bmp] 28 bmp Vt Set:  [280 mL] 280 mL PEEP:  [5 cmH20] 5 cmH20 Plateau Pressure:  [25 cmH20] 25 cmH20  No intake or output data in the 24 hours ending 12/17/19 1908 Filed Weights   12/17/19 1810  Weight: 50.3 kg    Examination: General: chronically and critically ill appearing female HENT: diffuse swelling of neck and face. Tracheostomy present with intact dressing and minimal bleeding Lungs: on PRVC mechanical ventilation. Distant  Cardiovascular: regular rate. Diffuse +3 pitting edema. Extremities warm Abdomen: soft Extremities: +3 pitting edema to all extremities Neuro: sedated GU: foley with evidence of sediment  Resolved Hospital Problem list   n/a  Assessment & Plan:   Chronic hypoxic and hypercapnic respiratory failure s/p tracheostomy by ENT on 2/58 complicated by severe pneumomediastinum. Trach proximal XLT 60.  Dr. Vaughan Browner spoke with ENT who is planning to take her back to OR tonight to exchange for distal Bronchoscopy unremarkable Plan Repeat CXR ABG Continue MV PRVC. VAP bundling F/u post op recs  Hypotension likely 2/2 thoracic restriction 2/2 pneumomediastinum. Can not rule out other causes including hemorrhage, infection however would consider these less likely. F/u blood cultures. PRN levo titrates for MAP goal >65  Paroxysmal atrial fibrillation. Currently in SR. Rate controlled with cardizem. Pressures soft at this time. Hold AC/cardizem for now. May resume in am if more appropriate.  Hypertension. Hold antihypertensives at this time.  T2DM. SSI    Best practice:  Diet: NPO. TF post op Pain/Anxiety/Delirium protocol (if indicated): fentanyl, versed VAP protocol (if indicated): yes DVT prophylaxis: resume subcut heparin in AM GI prophylaxis: pepcid Glucose control:  SSI Mobility: BR Code Status: Full Family Communication: will update in AM Disposition:  ICU  Labs   CBC: Recent Labs  Lab 12/12/19 1148 12/13/19 1016 12/14/19 1449 12/16/19 0847 12/16/19 1704  WBC 18.8* 16.9* 15.2* 18.7* 17.3*  HGB 9.6* 8.4* 8.3* 7.9* 9.6*  HCT 31.0* 26.5* 26.7* 25.5* 30.6*  MCV 70.6* 70.9* 70.3* 71.6* 76.3*  PLT 251 237 159 257 PLATELET CLUMPS NOTED ON SMEAR, COUNT APPEARS ADEQUATE    Basic Metabolic Panel: Recent Labs  Lab 12/11/19 0501 12/11/19 0501 12/12/19 0016 12/13/19 1016 12/14/19 1449 12/16/19 0847 12/16/19 1138  NA 140  --   --  139 141 137  --   K 5.3*   < > 5.0 5.6* 4.1 3.2* 3.1*  CL 95*  --   --  96* 95* 89*  --   CO2 34*  --   --  35* 31 32  --   GLUCOSE 185*  --   --  280* 216* 232*  --   BUN 102*  --   --  124* 133* 147*  --   CREATININE 0.88  --   --  1.19* 1.28* 1.37*  --   CALCIUM 8.4*  --   --  8.3* 8.1* 8.4*  --    < > = values in this interval not displayed.   GFR: Estimated Creatinine Clearance: 28.8 mL/min (A) (by C-G formula based on SCr of 1.37 mg/dL (H)). Recent Labs  Lab 12/13/19 1016 12/14/19 1449 12/16/19 0847 12/16/19 1704  WBC 16.9* 15.2* 18.7* 17.3*    Liver Function Tests: No results for input(s): AST, ALT, ALKPHOS, BILITOT, PROT, ALBUMIN in the last 168 hours. No results for input(s): LIPASE, AMYLASE in the last 168 hours. No results for input(s): AMMONIA in the last 168 hours.  ABG    Component Value Date/Time   PHART 7.412 12/06/2019 1758   PCO2ART 64.8 (H) 12/06/2019 1758   PO2ART 59.2 (L) 12/06/2019 1758   HCO3 40.7 (H) 12/06/2019 1758   O2SAT 92.4 12/06/2019 1758     Coagulation Profile: No results for input(s): INR, PROTIME in the last 168 hours.  Cardiac Enzymes: No results for input(s): CKTOTAL, CKMB, CKMBINDEX, TROPONINI in the last 168 hours.  HbA1C: Hgb A1c MFr Bld  Date/Time Value Ref Range Status  11/29/2019 04:44 PM 7.0 (H) 4.8 - 5.6 % Final    Comment:    (NOTE) Pre diabetes:          5.7%-6.4%  Diabetes:              >6.4%  Glycemic control for    <7.0% adults with diabetes     CBG: No results for input(s): GLUCAP in the last 168 hours.  Review of Systems:   Unable to be obtained due to critical illness  Past Medical History  She,  has a past medical history of Acute on chronic respiratory failure with hypoxia ([place]), COPD, severe ([place]), Lobar pneumonia, unspecified organism ([place]), and Ventilator dependence ([place]).   Surgical History   History reviewed. No pertinent surgical history.   Social History  Cigarette smoker since 69yo reportedly  Family History   Her family history is not on file.   Allergies Not on File   Home Medications  Prior to Admission medications   Not on [place], MD [place] PGY-2 12/17/19  7:08 PM

## 2019-12-17 NOTE — Progress Notes (Signed)
eLink Physician-Brief Progress Note Patient Name: Shelley Nicholson DOB: May 30, 1951 MRN: [id number]   Date of Service  12/17/2019  HPI/Events of Note  Nursing reports bleeding from nose after attempted NGT placement. Platelet count = 248.  eICU Interventions  Plan: 1. PT/INR and PTT now.      Intervention Category Major Interventions: Other:  Lysle Dingwall 12/17/2019, 11:00 PM

## 2019-12-17 NOTE — Transfer of Care (Signed)
Immediate Anesthesia Transfer of Care Note  Patient: Shelley Nicholson  Procedure(s) Performed: TRACHEOSTOMY EXCHANGE (N/A Neck)  Patient Location: ICU  Anesthesia Type:General  Level of Consciousness: Patient remains intubated per anesthesia plan  Airway & Oxygen Therapy: Patient remains intubated per anesthesia plan and Patient placed on Ventilator (see vital sign flow sheet for setting)  Post-op Assessment: Report given to RN and Post -op Vital signs reviewed and stable  Post vital signs: Reviewed and stable  Last Vitals:  Vitals Value Taken Time  BP 110/49 ABP   Temp    Pulse 66 12/17/19 2046  Resp 17 12/17/19 2046  SpO2 100 % 12/17/19 2046  Vitals shown include unvalidated device data.  Last Pain: There were no vitals filed for this visit.    Report to 4N ICU, Josh RT at bedside, applied to ventilator, confirmed ventilation via ausculation, PRVC 100% FiO2, Vt 280 RR 20, 5 PEEP. Patient maintained on fentanyl and propofol gtts per previous ICU plan, remaining blood cultures via left radial artery given to ICU to send to lab, VSS.    Complications: No complications documented.

## 2019-12-17 NOTE — Procedures (Signed)
Bronchoscopy Procedure Note  Shelley Nicholson  [id number]  November 15, 1950  Date:12/17/19  Time:7:07 PM   Provider Performing:Shelley Nicholson   Procedure(s):  Flexible Bronchoscopy (59[address])  Indication(s) Confirm placement of trach tube   Consent Unable to obtain consent due to emergent nature of procedure.  Anesthesia Propofol   Time Out Verified patient identification, verified procedure, site/side was marked, verified correct patient position, special equipment/implants available, medications/allergies/relevant history reviewed, required imaging and test results available.   Sterile Technique Usual hand hygiene, masks, gowns, and gloves were used   Procedure Description: Bronchoscope advanced through tracheostomy tube and into airway.  Airways were examined down to carina level with findings noted below.      Findings:  Tracheostomy tube appears to be in good position within the mainstem trachea about 8 cm above the carina.  No obvious tracheal damage noted  Complications/Tolerance None; patient tolerated the procedure well. Chest X-ray is needed post procedure.  Shelley Garfinkel MD  Pulmonary and Critical Care Please see [email or web address] for pager details.  12/17/2019, 7:09 PM

## 2019-12-17 NOTE — Brief Op Note (Signed)
12/17/2019  8:26 PM  PATIENT:  Shelley Nicholson  69 y.o. female  PRE-OPERATIVE DIAGNOSIS:  RESPIRATORY FAILURE  POST-OPERATIVE DIAGNOSIS:  RESPIRATORY FAILURE  PROCEDURE:  Procedure(s): TRACHEOSTOMY (N/A) exchange from 60 proximal XLT trach to Bivona 7.0 mm 120 mm length trach  SURGEON:  Surgeon(s) and Role:    Rozetta Nunnery, MD - Primary  PHYSICIAN ASSISTANT:   ASSISTANTS: none   ANESTHESIA:   general  EBL:  10 mL   BLOOD ADMINISTERED:none  DRAINS: none   LOCAL MEDICATIONS USED:  NONE  SPECIMEN:  No Specimen  DISPOSITION OF SPECIMEN:  N/A  COUNTS:  YES  TOURNIQUET:  * No tourniquets in log *  DICTATION: .Other Dictation: Dictation Number [id number]  PLAN OF CARE: Admit to inpatient   PATIENT DISPOSITION:  PACU - hemodynamically stable.   Delay start of Pharmacological VTE agent (>24hrs) due to surgical blood loss or risk of bleeding: yes

## 2019-12-17 NOTE — Progress Notes (Signed)
Assisted Dr. Vaughan Browner with bedside bronchoscopy to verify tracheostomy placement. Per Dr. Vaughan Browner pts trach is in good position. Pt remained on 100% FiO2 for procedure. RT will continue to monitor.

## 2019-12-17 NOTE — Op Note (Signed)
NAMESHERLIN, Shelley Nicholson MEDICAL RECORD [id number] ACCOUNT [id number] DATE OF BIRTH:[birth year] FACILITY[facility] LOCATION: [facility] PHYSICIAN:Ellenora Talton Lincoln Maxin, MD  OPERATIVE REPORT  DATE OF PROCEDURE:  12/17/2019  PREOPERATIVE DIAGNOSIS:  Acute on chronic respiratory failure.  POSTOPERATIVE DIAGNOSIS:  Acute on chronic respiratory failure.  OPERATION PERFORMED:  Tracheostomy with a 60 proximal XLT tracheostomy tube.  SURGEON:  [name], MD  ANESTHESIA:  General endotracheal.  ESTIMATED BLOOD LOSS:  10 mL.  COMPLICATIONS:  None.  BRIEF CLINICAL NOTE:  The patient is a 69 year old female who was transferred to [facility] about 2 weeks ago and intubated on a vent.  She had previously been intubated a week and a half prior to transfer.  She had a previous extubation  that was unsuccessful at the outside hospital.  She has been on the ventilator since she was transferred to [facility].  I was consulted concerning possible tracheostomy last week on her.  On exam, she has a short thick neck with history  of a goiter with palpable mass.  She is taken to the operating room at this time for tracheostomy.  DESCRIPTION OF PROCEDURE:  The patient was brought straight from [facility], intubated on a vent.  She remained in her bed.  A roll was placed underneath the shoulders to extend her neck.  Neck was then prepped with Betadine solution and  draped in sterile towels.  The proposed incision site was palpated and marked with a vertical incision just above the suprasternal notch.  It was difficult to palpate the trach because of the large goiter.  A vertical incision was made over approximately  the level of the midline trach, lower neck.  Dissection was carried down through subcutaneous tissue.  Strap muscles were identified and retracted laterally.  With deep palpation, the trachea was palpated.  Cricoid cartilage was identified and the  first  2 or 3 tracheal rings were exposed.  Lurline Idol was very deep and horizontal incision was made just below the cricoid cartilage about 1 ring below.  It was elected to use a proximal XLT 60 tracheotomy tube.  After performing the tracheotomy the endotracheal  tube was removed and 1st pass of the proximal XLT trach was not situated well and was not forced, but was not ventilating well.  This was removed and a 6-1/2 endotracheal tube was then placed down the tracheotomy site.  The patient was ventilated much  better with the 66-1/2 endotracheal tube placed through the tracheotomy site with the balloon at the opening of the tracheotomy.  Next fiberoptic bronchoscopy was performed through the tracheotomy and there was thickening and really could not visualize a  good tracheal lumen.  Finally, repassing the bronchoscope it was able to be passed into what appeared to a proper tracheal lumen.  The tracheotomy tube was then passed over the bronchoscope down into the lumen and the patient was ventilated well with  good gases and good ventilation.  The proximal XLT 60 trach was then secured with 2-0 silk sutures x4 and Velcro trach collar around the neck.  The patient was ventilated well with good saturations and was transferred back to [facility].  CN/NUANCE  D:12/17/2019 T:12/17/2019 JOB:[id number]

## 2019-12-17 NOTE — Progress Notes (Signed)
eLink Physician-Brief Progress Note Patient Name: Shelley Nicholson DOB: August 29, 1950 MRN: [id number]   Date of Service  12/17/2019  HPI/Events of Note  Patient transferred from [facility] with Foley catheter. No order for Foley catheter.   eICU Interventions  Plan: 1. Continue Foley catheter.         Jaleiah Asay Cornelia Copa 12/17/2019, 10:23 PM

## 2019-12-17 NOTE — Anesthesia Procedure Notes (Signed)
Arterial Line Insertion Start/End7/05/2020 8:18 PM, 12/17/2019 8:19 PM Performed by: Roberts Gaudy, MD  Patient location: OR. Emergency situation Patient sedated Left, radial was placed Catheter size: 20 G Hand hygiene performed  and Seldinger technique used Allen's test indicative of satisfactory collateral circulation Attempts: 1 Procedure performed using ultrasound guided technique. Ultrasound Notes:anatomy identified, needle tip was noted to be adjacent to the nerve/plexus identified, no ultrasound evidence of intravascular and/or intraneural injection and image(s) printed for medical record Following insertion, dressing applied. Patient tolerated the procedure well with no immediate complications.

## 2019-12-17 NOTE — H&P (Signed)
PREOPERATIVE H&P  Chief Complaint: Respiratory failure  HPI: Shelley Nicholson is a 69 y.o. female who presents for evaluation of for tracheostomy.  Patient with history of goiter, hypertension and COPD with exacerbation.  Patient was initially intubated at outside facility.  Attempted extubation at outside facility was unsuccessful and patient was reintubated.  [facility] intubated and on ventilator.  Attempts to wean the patient had been unsuccessful and tracheostomy was recommended.  Patient has now been orally intubated for over 3 weeks and on ventilator.  Of note she has a goiter.  Her Lovenox will be stopped a minimum of 3 days prior to tracheostomy.  Past Medical History:  Diagnosis Date  . Acute on chronic respiratory failure with hypoxia ([place])   . COPD, severe ([place])   . Lobar pneumonia, unspecified organism ([place])   . Ventilator dependence [facility])     Social History   Socioeconomic History  . Marital status: Widowed    Spouse name: Not on file  . Number of children: Not on file  . Years of education: Not on file  . Highest education level: Not on file  Occupational History  . Not on file  Tobacco Use  . Smoking status: Not on file  Substance and Sexual Activity  . Alcohol use: Not on file  . Drug use: Not on file  . Sexual activity: Not on file  Other Topics Concern  . Not on file  Social History Narrative  . Not on file   Social Determinants of Health   Financial Resource Strain:   . Difficulty of Paying Living Expenses:   Food Insecurity:   . Worried About Charity fundraiser in the Last Year:   . Arboriculturist in the Last Year:   Transportation Needs:   . Film/video editor (Medical):   Marland Kitchen Lack of Transportation (Non-Medical):   Physical Activity:   . Days of Exercise per Week:   . Minutes of Exercise per Session:   Stress:   . Feeling of Stress :   Social Connections:   . Frequency of  Communication with Friends and Family:   . Frequency of Social Gatherings with Friends and Family:   . Attends Religious Services:   . Active Member of Clubs or Organizations:   . Attends Archivist Meetings:   Marland Kitchen Marital Status:    No family history on file. Not on File Prior to Admission medications   Not on File     Positive ROS: Patient is unable to answer questions as he is intubated however family members state that she does respond.  All other systems have been reviewed and were otherwise negative with the exception of those mentioned in the HPI and as above.  Physical Exam: There were no vitals filed for this visit.  General: Patient is intubated and does not respond to me but of apparently response to family members She is obese with a short neck. Nasal: Clear nasal passages Neck: She has a short thick neck with palpable goiter. Cardiovascular: Regular rate and rhythm, no murmur.  Respiratory: Clear to auscultation   Assessment/Plan: RESPIRATORY FAILURE Plan for Procedure(s): TRACHEOSTOMY   [name], MD 12/17/2019 7:27 AM

## 2019-12-17 NOTE — Transfer of Care (Signed)
Immediate Anesthesia Transfer of Care Note  Patient: Shelley Nicholson  Procedure(s) Performed: TRACHEOSTOMY (N/A Neck)  [facility]  Anesthesia Type:Regional  Level of Consciousness: Patient remains intubated per anesthesia plan  Airway & Oxygen Therapy: Patient remains intubated per anesthesia plan and Patient placed on Ventilator (see vital sign flow sheet for setting)  Post-op Assessment: Report given to RN and Post -op Vital signs reviewed and stable  Post vital signs: Reviewed and stable  Last Vitals:  Vitals Value Taken Time  BP 149/56   Temp    Pulse 83   Resp 12   SpO2 100%     Last Pain: There were no vitals filed for this visit.       Complications: No complications documented.

## 2019-12-17 NOTE — Brief Op Note (Signed)
12/17/2019  12:04 PM  PATIENT:  Shelley Nicholson  69 y.o. female  PRE-OPERATIVE DIAGNOSIS:  RESPIRATORY FAILURE  POST-OPERATIVE DIAGNOSIS:  RESPIRATORY FAILURE  PROCEDURE:  Procedure(s): TRACHEOSTOMY (N/A)       Proximal XLT 60 tracheostomy tube  SURGEON:  Surgeon(s) and Role:    Rozetta Nunnery, MD - Primary  PHYSICIAN ASSISTANT:   ASSISTANTS: none   ANESTHESIA:   general  EBL:  10 mL   BLOOD ADMINISTERED:none  DRAINS: none   LOCAL MEDICATIONS USED:  XYLOCAINE   SPECIMEN:  No Specimen  DISPOSITION OF SPECIMEN:  N/A  COUNTS:  YES  TOURNIQUET:  * No tourniquets in log *  DICTATION: .Other Dictation: Dictation Number [id number]  PLAN OF CARE: Discharge to home after PACU  PATIENT DISPOSITION:  PACU - hemodynamically stable.   Delay start of Pharmacological VTE agent (>24hrs) due to surgical blood loss or risk of bleeding: yes

## 2019-12-17 NOTE — Interval H&P Note (Signed)
History and Physical Interval Note:  12/17/2019 7:34 AM  Shelley Nicholson  has presented today for surgery, with the diagnosis of RESPIRATORY FAILURE.  The various methods of treatment have been discussed with the patient and family. After consideration of risks, benefits and other options for treatment, the patient has consented to  Procedure(s): TRACHEOSTOMY (N/A) as a surgical intervention.  The patient's history has been reviewed, patient examined, no change in status, stable for surgery.  I have reviewed the patient's chart and labs.  Questions were answered to the patient's satisfaction.     [name]

## 2019-12-17 NOTE — Anesthesia Preprocedure Evaluation (Addendum)
Anesthesia Evaluation  Patient identified by MRN, date of birth, ID band Patient unresponsive    Reviewed: Unable to perform ROS - Chart review only  Airway Mallampati: Intubated       Dental   Pulmonary COPD,     + decreased breath sounds      Cardiovascular  Rhythm:Regular     Neuro/Psych    GI/Hepatic   Endo/Other    Renal/GU      Musculoskeletal   Abdominal   Peds  Hematology   Anesthesia Other Findings   Reproductive/Obstetrics                            Anesthesia Physical Anesthesia Plan  ASA: IV  Anesthesia Plan: General   Post-op Pain Management:    Induction: Intravenous and Inhalational  PONV Risk Score and Plan: 3 and Treatment may vary due to age or medical condition and Ondansetron  Airway Management Planned: Tracheostomy and Oral ETT  Additional Equipment: None  Intra-op Plan:   Post-operative Plan: Post-operative intubation/ventilation  Informed Consent:     History available from chart only  Plan Discussed with: CRNA and Surgeon  Anesthesia Plan Comments:         Anesthesia Quick Evaluation

## 2019-12-17 NOTE — Anesthesia Preprocedure Evaluation (Addendum)
Anesthesia Evaluation  Patient identified by MRN, date of birth, ID band Patient unresponsive    Reviewed: Patient's Chart, lab work & pertinent test results, Unable to perform ROS - Chart review onlyPreop documentation limited or incomplete due to emergent nature of procedure.  Airway Mallampati: Trach       Dental  (+) Edentulous Upper, Edentulous Lower   Pulmonary    + rhonchi        Cardiovascular  Rhythm:Irregular Rate:Tachycardia     Neuro/Psych    GI/Hepatic   Endo/Other    Renal/GU      Musculoskeletal   Abdominal (+) + obese,   Peds  Hematology   Anesthesia Other Findings   Reproductive/Obstetrics                            Anesthesia Physical Anesthesia Plan  ASA: IV and emergent  Anesthesia Plan: General   Post-op Pain Management:    Induction: Intravenous  PONV Risk Score and Plan:   Airway Management Planned: Oral ETT and Tracheostomy  Additional Equipment: Arterial line  Intra-op Plan:   Post-operative Plan: Post-operative intubation/ventilation  Informed Consent:     Only emergency history available and History available from chart only  Plan Discussed with: CRNA, Anesthesiologist and Surgeon  Anesthesia Plan Comments:        Anesthesia Quick Evaluation

## 2019-12-17 NOTE — Progress Notes (Signed)
PHARMACY NOTE:  ANTIMICROBIAL RENAL DOSAGE ADJUSTMENT  Current antimicrobial regimen includes a mismatch between antimicrobial dosage and estimated renal function.  As per policy approved by the Pharmacy & Therapeutics and Medical Executive Committees, the antimicrobial dosage will be adjusted accordingly.  Current antimicrobial dosage:  Ancef 1gm IV Q8H    Indication: Surgical prophylaxis  Renal Function:  Estimated Creatinine Clearance: 27.2 mL/min (A) (by C-G formula based on SCr of 1.45 mg/dL (H)). []      On intermittent HD, scheduled: []      On CRRT    Antimicrobial dosage has been changed to:  Ancef 1gm IV Q12H  Additional comments:  F/u with team in AM regarding LOT  Carrigan Delafuente D. Mina Marble, PharmD, BCPS, [place] 12/17/2019, 9:53 PM

## 2019-12-17 NOTE — Anesthesia Postprocedure Evaluation (Signed)
Anesthesia Post Note  Patient: Shelley Nicholson  Procedure(s) Performed: TRACHEOSTOMY EXCHANGE (N/A Neck)     Patient location during evaluation: ICU Anesthesia Type: General Level of consciousness: patient remains intubated per anesthesia plan, obtunded/minimal responses and sedated Vital Signs Assessment: post-procedure vital signs reviewed and stable Respiratory status: patient on ventilator - see flowsheet for VS and patient remains intubated per anesthesia plan Cardiovascular status: blood pressure returned to baseline Anesthetic complications: no   No complications documented.  Last Vitals:  Vitals:   12/17/19 1840 12/17/19 1900  BP: (!) 96/48 (!) 135/39  Pulse: 65 73  Resp: 14 14  SpO2: 94% 100%    Last Pain: There were no vitals filed for this visit.               Yaser Harvill COKER

## 2019-12-18 ENCOUNTER — Encounter (HOSPITAL_COMMUNITY): Payer: Self-pay | Admitting: Otolaryngology

## 2019-12-18 ENCOUNTER — Inpatient Hospital Stay (HOSPITAL_COMMUNITY): Payer: Medicare Other

## 2019-12-18 ENCOUNTER — Inpatient Hospital Stay
Admission: AD | Admit: 2019-12-18 | Discharge: 2020-01-06 | Disposition: E | Payer: Self-pay | Source: Other Acute Inpatient Hospital | Attending: Internal Medicine | Admitting: Internal Medicine

## 2019-12-18 ENCOUNTER — Other Ambulatory Visit (HOSPITAL_COMMUNITY): Payer: Self-pay

## 2019-12-18 DIAGNOSIS — J9690 Respiratory failure, unspecified, unspecified whether with hypoxia or hypercapnia: Secondary | ICD-10-CM

## 2019-12-18 DIAGNOSIS — T797XXA Traumatic subcutaneous emphysema, initial encounter: Secondary | ICD-10-CM

## 2019-12-18 DIAGNOSIS — Z992 Dependence on renal dialysis: Secondary | ICD-10-CM

## 2019-12-18 DIAGNOSIS — N179 Acute kidney failure, unspecified: Secondary | ICD-10-CM

## 2019-12-18 DIAGNOSIS — Z9911 Dependence on respirator [ventilator] status: Secondary | ICD-10-CM

## 2019-12-18 DIAGNOSIS — R34 Anuria and oliguria: Secondary | ICD-10-CM

## 2019-12-18 LAB — BLOOD GAS, ARTERIAL
Acid-Base Excess: 4.2 mmol/L — ABNORMAL HIGH (ref 0.0–2.0)
Bicarbonate: 29.7 mmol/L — ABNORMAL HIGH (ref 20.0–28.0)
FIO2: 50
O2 Saturation: 94.1 %
Patient temperature: 36.4
pCO2 arterial: 56.1 mmHg — ABNORMAL HIGH (ref 32.0–48.0)
pH, Arterial: 7.34 — ABNORMAL LOW (ref 7.350–7.450)
pO2, Arterial: 69.9 mmHg — ABNORMAL LOW (ref 83.0–108.0)

## 2019-12-18 LAB — CK: Total CK: 731 U/L — ABNORMAL HIGH (ref 38–234)

## 2019-12-18 LAB — CBC
HCT: 31.1 % — ABNORMAL LOW (ref 36.0–46.0)
Hemoglobin: 9.6 g/dL — ABNORMAL LOW (ref 12.0–15.0)
MCH: 24 pg — ABNORMAL LOW (ref 26.0–34.0)
MCHC: 30.9 g/dL (ref 30.0–36.0)
MCV: 77.8 fL — ABNORMAL LOW (ref 80.0–100.0)
Platelets: 248 10*3/uL (ref 150–400)
RBC: 4 MIL/uL (ref 3.87–5.11)
WBC: 16.5 10*3/uL — ABNORMAL HIGH (ref 4.0–10.5)
nRBC: 0.7 % — ABNORMAL HIGH (ref 0.0–0.2)

## 2019-12-18 LAB — BASIC METABOLIC PANEL
Anion gap: 14 (ref 5–15)
BUN: 150 mg/dL — ABNORMAL HIGH (ref 8–23)
CO2: 31 mmol/L (ref 22–32)
Calcium: 8.3 mg/dL — ABNORMAL LOW (ref 8.9–10.3)
Chloride: 93 mmol/L — ABNORMAL LOW (ref 98–111)
Creatinine, Ser: 1.43 mg/dL — ABNORMAL HIGH (ref 0.44–1.00)
GFR calc Af Amer: 43 mL/min — ABNORMAL LOW (ref >60)
GFR calc non Af Amer: 37 mL/min — ABNORMAL LOW (ref >60)
Glucose, Bld: 97 mg/dL (ref 70–99)
Potassium: 3.8 mmol/L (ref 3.5–5.1)
Sodium: 138 mmol/L (ref 135–145)

## 2019-12-18 LAB — GLUCOSE, CAPILLARY
Glucose-Capillary: 90 mg/dL (ref 70–99)
Glucose-Capillary: 78 mg/dL (ref 70–99)
Glucose-Capillary: 104 mg/dL — ABNORMAL HIGH (ref 70–99)

## 2019-12-18 LAB — LACTIC ACID, PLASMA: Lactic Acid, Venous: 0.8 mmol/L (ref 0.5–1.9)

## 2019-12-18 LAB — TRIGLYCERIDES: Triglycerides: 158 mg/dL — ABNORMAL HIGH (ref <150)

## 2019-12-18 LAB — LACTATE DEHYDROGENASE: LDH: 612 U/L — ABNORMAL HIGH (ref 98–192)

## 2019-12-18 MED ORDER — ALBUTEROL SULFATE (2.5 MG/3ML) 0.083% IN NEBU: 2.5000 mg | INHALATION_SOLUTION | RESPIRATORY_TRACT | 12 refills | Status: AC | PRN

## 2019-12-18 MED ORDER — IPRATROPIUM-ALBUTEROL 0.5-2.5 (3) MG/3ML IN SOLN: 3.0000 mL | Freq: Four times a day (QID) | RESPIRATORY_TRACT | Status: AC

## 2019-12-18 MED ORDER — ALBUTEROL SULFATE (2.5 MG/3ML) 0.083% IN NEBU: 2.5000 mg | INHALATION_SOLUTION | RESPIRATORY_TRACT | Status: DC | PRN

## 2019-12-18 MED ORDER — DEXMEDETOMIDINE HCL IN NACL 400 MCG/100ML IV SOLN: 0.4000 ug/kg/h | INTRAVENOUS | Status: DC

## 2019-12-18 MED ORDER — SODIUM CHLORIDE 0.9 % IV BOLUS
500.0000 mL | Freq: Once | INTRAVENOUS | Status: AC
  Administered 2019-12-18: 500 mL via INTRAVENOUS

## 2019-12-18 MED ORDER — PROSOURCE TF PO LIQD
45.0000 mL | Freq: Two times a day (BID) | ORAL | Status: DC
Start: 2019-12-18 — End: 2019-12-18

## 2019-12-18 MED ORDER — VITAL AF 1.2 CAL PO LIQD: 1000.0000 mL | ORAL | Status: DC

## 2019-12-18 MED ORDER — CEFAZOLIN SODIUM-DEXTROSE 1-4 GM/50ML-% IV SOLN
1.0000 g | Freq: Once | INTRAVENOUS | Status: DC
  Filled 2019-12-18: qty 50

## 2019-12-18 MED ORDER — FENTANYL CITRATE (PF) 100 MCG/2ML IJ SOLN: 25.0000 ug | INTRAMUSCULAR | Status: DC | PRN

## 2019-12-18 MED ORDER — BUDESONIDE 0.5 MG/2ML IN SUSP
0.5000 mg | Freq: Two times a day (BID) | RESPIRATORY_TRACT | Status: DC
  Administered 2019-12-18: 0.5 mg via RESPIRATORY_TRACT
  Filled 2019-12-18: qty 2

## 2019-12-18 MED ORDER — BUDESONIDE 0.5 MG/2ML IN SUSP: 0.5000 mg | Freq: Two times a day (BID) | RESPIRATORY_TRACT | 12 refills | Status: AC

## 2019-12-18 MED ORDER — FUROSEMIDE 10 MG/ML IJ SOLN
40.0000 mg | Freq: Once | INTRAMUSCULAR | Status: AC
  Administered 2019-12-18: 40 mg via INTRAVENOUS
  Filled 2019-12-18: qty 4

## 2019-12-18 MED ORDER — SODIUM CHLORIDE 0.9 % IV SOLN: INTRAVENOUS | Status: DC

## 2019-12-18 MED ORDER — CHLORHEXIDINE GLUCONATE CLOTH 2 % EX PADS: 6.0000 | MEDICATED_PAD | Freq: Every day | CUTANEOUS | Status: DC

## 2019-12-18 MED ORDER — DILTIAZEM 12 MG/ML ORAL SUSPENSION
30.0000 mg | Freq: Three times a day (TID) | ORAL | Status: DC
  Administered 2019-12-18: 30 mg
  Filled 2019-12-18 (×2): qty 3

## 2019-12-18 MED ORDER — ARFORMOTEROL TARTRATE 15 MCG/2ML IN NEBU: 15.0000 ug | INHALATION_SOLUTION | Freq: Two times a day (BID) | RESPIRATORY_TRACT | Status: AC

## 2019-12-18 MED ORDER — ARFORMOTEROL TARTRATE 15 MCG/2ML IN NEBU
15.0000 ug | INHALATION_SOLUTION | Freq: Two times a day (BID) | RESPIRATORY_TRACT | Status: DC
  Administered 2019-12-18: 15 ug via RESPIRATORY_TRACT
  Filled 2019-12-18 (×2): qty 2

## 2019-12-18 MED ORDER — SODIUM CHLORIDE 0.9% FLUSH: 10.0000 mL | INTRAVENOUS | Status: DC | PRN

## 2019-12-18 MED ORDER — POTASSIUM CHLORIDE 20 MEQ/15ML (10%) PO SOLN
20.0000 meq | Freq: Once | ORAL | Status: AC
  Administered 2019-12-18: 20 meq
  Filled 2019-12-18: qty 15

## 2019-12-18 MED ORDER — DILTIAZEM 12 MG/ML ORAL SUSPENSION: 30.0000 mg | Freq: Three times a day (TID) | ORAL | Status: AC

## 2019-12-18 MED ORDER — CHLORHEXIDINE GLUCONATE CLOTH 2 % EX PADS
6.0000 | MEDICATED_PAD | Freq: Every day | CUTANEOUS | Status: DC
  Administered 2019-12-17: 6 via TOPICAL

## 2019-12-18 MED ORDER — SODIUM CHLORIDE 0.9% FLUSH
10.0000 mL | Freq: Two times a day (BID) | INTRAVENOUS | Status: DC
  Administered 2019-12-18: 10 mL

## 2019-12-18 MED ORDER — IPRATROPIUM-ALBUTEROL 0.5-2.5 (3) MG/3ML IN SOLN
3.0000 mL | Freq: Four times a day (QID) | RESPIRATORY_TRACT | Status: DC
  Administered 2019-12-18: 3 mL via RESPIRATORY_TRACT
  Filled 2019-12-18: qty 3

## 2019-12-18 NOTE — Progress Notes (Signed)
Pt transported with RN and NT from 2M12 to CT1 on ventilator. Pt then transported from CT1 to [facility] on ventilator. Bedside report given to RT and pt switched to new units ventilator. Pt tolerated transport well. Care resumed by 5E RT.

## 2019-12-18 NOTE — Progress Notes (Signed)
RT found pt on CPAP/PS 10/5 50%. Pt returned to full support settings due to low VT and increased WOB. RT will continue to monitor.

## 2019-12-18 NOTE — Op Note (Signed)
NAMELONA, SIX MEDICAL RECORD [id number] ACCOUNT [id number] DATE OF BIRTH:12-29-1950 FACILITY[facility] LOCATION: [facility] PHYSICIAN:Heloise Gordan Lincoln Maxin, MD  OPERATIVE REPORT  DATE OF PROCEDURE:  12/17/2019  PREOPERATIVE DIAGNOSIS:  Previous tracheostomy with a 60 proximal XLT trach with tracheal leak and subcutaneous emphysema, which is increasing.  POSTOPERATIVE DIAGNOSIS:  Previous tracheostomy with a 60 proximal XLT trach with tracheal leak and subcutaneous emphysema, which is increasing.  OPERATION PERFORMED:  Exchange of trach for a Bivona 7 mm, 120 mm length tracheotomy tube.  SURGEON:  [name], MD  ANESTHESIA:  General endotracheal.  ESTIMATED BLOOD LOSS:  None.  COMPLICATIONS:  None.  BRIEF CLINICAL NOTE:  The patient is a 69 year old female who underwent a tracheotomy earlier this morning and had a prominent thick neck and a large goiter requiring placement of a proximal XLT 60 trach.  This was a little bit difficult to pass.  It had  to be passed over a bronchoscope.  She was ventilated well and was transferred back to [facility].  However, upon arriving at [facility], she had increasing subcutaneous area and emphysema and a followup CT scan showed  increasing mediastinal air as well as neck, facial emphysema with probable leak of air from possible tear in the trachea.  The patient was subsequently transferred down to medical ICU under pulmonary care.  She was slightly unstable and they recommended  that we exchange the trach to a longer trach, so that she stops collecting so much subcutaneous emphysema.  The patient was taken to the operating room at this time for exchanging of the 60 proximal XLT 2 trach to a Bivona 70, 120 mm length trach.  I  reviewed the CT scan.  The trachea was in good position in the trachea and is probably 6 cm proximal to the carina and discussed with ICU concerning exchanging to a longer trach down  to within 2-3 cm of the carina.    DESCRIPTION OF PROCEDURE:  The patient was brought to the operating room to have this performed.  While in the operating room with the help of anesthesia, it was elected to remove the present trach and pass over a wire.  The Bivona trach was selected as  this was 3-4 cm longer than the present trach.  The guidewire was passed over the present trach and the trach was removed and the new Bivona trach was easily passed down through the trachea opening that could easily be visualized with retractors in the  neck.  This passed rather easily.  It was secured to the neck with 2-0 silk sutures x4 and a Velcro trach collar around the neck.  After the patient was ventilated well with the new Bivona trach, following placement of the trach, endoscopy was performed  through the trach and the trach appeared to be within 3 cm of the carina.  The patient tolerated this well and was subsequently transferred back to the ICU.  CN/NUANCE  D:12/17/2019 T:12/08/2019 JOB:[id number]

## 2019-12-18 NOTE — Progress Notes (Signed)
eLink Physician-Brief Progress Note Patient Name: Shelley Nicholson DOB: May 10, 1951 MRN: [id number]   Date of Service  12/28/2019  HPI/Events of Note  OLiguria - No urine output post fluid challenge.   eICU Interventions  Plan: 1. Monitor CVP now and Q 4 hours.      Intervention Category Major Interventions: Other:  Lysle Dingwall 12/28/2019, 3:41 AM

## 2019-12-18 NOTE — Progress Notes (Signed)
Patient transferred to CT then [facility] with no complications. Report given to Channel, Therapist, sports.

## 2019-12-18 NOTE — Progress Notes (Signed)
[place] Progress Note Patient Name: Shelley Nicholson DOB: 1951-01-26 MRN: [id number]   Date of Service  01/01/2020  HPI/Events of Note  Oliguria - CVP = 10.   eICU Interventions  Plan: 1. Bolus with 0.9 NaCl 500 mL IV over 30 minutes now.      Intervention Category Major Interventions: Other:  Lysle Dingwall 12/11/2019, 4:47 AM

## 2019-12-18 NOTE — Discharge Summary (Signed)
Physician Discharge Summary         Patient ID: Shelley Nicholson MRN: [id number] DOB/AGE: Oct 26, 1950 69 y.o.  Admit date: 12/17/2019 Discharge date: 01/04/2020  Discharge Diagnoses:    Pneumomediastinum with diffuse subcutaneous emphysema post tracheostomy placement Chronic hypoxic and hypercapnic respiratory failure COPD AKI PAF HTN Diabetes Protein calorie malnutrition   Discharge summary    69 year old female with COPD, goiter, HTN, paroxysmal afib who initially was admitted to [place] critical care unit for severe COPD exacerbation on 6/17. She ultimately required intubation on the day of admission. For an unclear reason, her chart indicates she was extubated that same day however required re-intubation due to progressive respiratory failure. Sputum cultures were + for haemophilus which was treated with rocephin. She was unable to be weaned off the vent and [facility].  After failed weaning attempts, ENT was consulted and underwent tracheostomy tube placement on 7/12.  Post procedure, developed severe subcutaneous emphysema and found on chest CT to have extensive pneumomediastinum with ongoing leak noted.  She was transferred to [facility], PCCM accepting for further evaluation and management, with ENT following.  She returned to the OR by ENT on the evening of 7/12 for tracheostomy tube exchange.  Overnight, she did develop some hypotension requiring fluid bolus and brief vasopressor support, likely secondary to sedation.  Cultures sent empirically.  Additionally noted to have ongoing AKI with oliguria and marked anasarca, previously noted at [facility].  As of 7/13, her subcutaneous emphysema is resolving, she is ventilating well, and CXR remains stable.  Her sedation was weaned and blood pressure is now hypertensive.  She has been weaning well on pressure support ventilation.  She is hemodynamically stable and ready to return to [facility].    Discharge Plan by Active Problems    Pneumomediastinum with diffuse subcutaneous emphysema post tracheostomy placement - thought related to tracheal disruption during initial procedure, s/p trach exchange 7/12 PM to [place] Per ENT Improving subcu emphysema, will reabsorb slowly, continue to monitor  Chronic hypoxic and hypercapnic respiratory failure s/p tracheostomy COPD Plan Full MV support PRVC currently at 8 cc/kg, rate 20 Weaning FiO2 as able for sat goal > 88% VAP bundling/ PPI duonebs q 6 and prn albuterol, Pulmicort, brovanna  PAD protocol-minimize sedation, prn fentanyl, consider precedex if needed or enteral sedation for RASS goal 0 with bowel regimen IR consult for PEG  Hypotension - resolved, likely secondary to sedation Plan Ancef per pharmacy  Follow culture data (including trach asp from [facility] 7/11) Trend WBC/ fever curve   AKI/ uremia with anasarca Plan Pending UA, renal ultrasound to r/o obstruction, and FEUr (previously on diuretics at [facility]) pending CK, LA, LDH  S/p lasix dose 7/13 Continue foley  Strict I/Os/ trending renal indices   Paroxysmal atrial fibrillation Plan Tele monitoring- currently SR  Hypertension Plan Cardizem resumed Prn apresoline  T2DM- controlled Plan SSI   Protein calorie malnutrition Plan Resume TF   Sacral decub on admit Plan  Per [facility] wound care   [facility] tests/ studies  7/12 CT chest>>extensive pneumomediastinum and severe, very extensive subcutaneous emphysema about the chest, neck, and upper abdomen. No pneumothorax. Clustered heterogenous airspace opacities in the bilateral lung spaces consistent with aspiration. Severe emphysema  7/13 renal US >>  Procedures   pta RUE PICC >> pta foley >>  7/12 tracheostomy XLT 60 >> changed to brovonna 7/12 >> 7/12 left radial aline >>7/13  Culture data/antimicrobials   7/11  tracheal aspirate>>NGTD 7/12 BCx2 >>   Consults  ENT IR (7/13  for PEG)  Discharge Exam: BP 140/60    Pulse (!) 109    Temp 98 F (36.7 C) (Axillary)    Resp 16    Wt 71.4 kg    SpO2 93%    BMI 29.74 kg/m   General:  Chronically ill appearing female in NAD HEENT: MM pink/moist, midline brovanna trach, L NGT,Neuro: Moves spont but not f/c CV: rr PULM: non labored, back on full PRVC support, coarse, scattered rhonchi GI: obese, soft, bs+, foley Extremities: warm/dry, 3-4+ pitting edema up to abd, BUE edema/ weeping areas Skin: posterior not visualized,  improving diffuse SQ emphysema to chest, arms, neck and face   Labs at discharge   Lab Results  Component Value Date   CREATININE 1.43 (H) 12/30/2019   BUN 150 (H) 12/15/2019   NA 138 12/17/2019   K 3.8 12/09/2019   CL 93 (L) 12/27/2019   CO2 31 12/29/2019   Lab Results  Component Value Date   WBC 16.5 (H) 12/07/2019   HGB 9.6 (L) 12/31/2019   HCT 31.1 (L) 12/17/2019   MCV 77.8 (L) 12/14/2019   PLT 248 12/27/2019   Lab Results  Component Value Date   ALT 111 (H) 12/17/2019   AST 51 (H) 12/17/2019   ALKPHOS 97 12/17/2019   BILITOT 0.9 12/17/2019   Lab Results  Component Value Date   INR 1.0 12/17/2019    Current radiological studies    DG Abd 1 View  Result Date: 12/17/2019 CLINICAL DATA:  Nasogastric tube placement. EXAM: ABDOMEN - 1 VIEW COMPARISON:  Same day. FINDINGS: The bowel gas pattern is normal. Distal tip of nasogastric tube is seen in the expected position of the gastroesophageal junction. No radio-opaque calculi or other significant radiographic abnormality are seen. IMPRESSION: Distal tip of nasogastric tube seen in expected position of gastroesophageal junction. Advancement is recommended. Electronically Signed   By: Marijo Conception M.D.   On: 12/17/2019 12:42   CT CHEST W CONTRAST  Result Date: 12/17/2019 CLINICAL DATA:  Possible pneumothorax, neck mass EXAM: CT CHEST WITH CONTRAST TECHNIQUE: Multidetector CT imaging of the chest was performed during intravenous  contrast administration. CONTRAST:  30mL OMNIPAQUE IOHEXOL 300 MG/ML  SOLN COMPARISON:  Same day chest radiograph FINDINGS: Cardiovascular: Aortic atherosclerosis. Right upper extremity PICC. Normal heart size. Three-vessel coronary artery calcifications. No pericardial effusion. Mediastinum/Nodes: Extensive pneumomediastinum. No enlarged mediastinal, hilar, or axillary lymph nodes. Grossly enlarged, heterogeneous thyroid goiter, partially imaged. Tracheostomy tube in the included upper trachea. And esophagus demonstrate no significant findings. Lungs/Pleura: Severe centrilobular emphysema. Clustered heterogeneous airspace opacity in the dependent bilateral lung bases. No pleural effusion or pneumothorax. Upper Abdomen: No acute abnormality. Musculoskeletal: No chest wall mass or suspicious bone lesions identified. Severe, very extensive subcutaneous emphysema about the chest, neck, included upper abdomen. Anasarca. IMPRESSION: 1. Extensive pneumomediastinum and severe, very extensive subcutaneous emphysema about the chest, neck, included upper abdomen. There is no obvious source, but presumably is related to recent tracheostomy. 2. No pneumothorax. 3. Clustered heterogeneous airspace opacity in the dependent bilateral lung bases, consistent with aspiration. 4. Severe emphysema.  Emphysema (ICD10-J43.9). 5. Coronary artery disease.  Aortic Atherosclerosis (ICD10-I70.0). 6. Grossly enlarged, heterogeneous thyroid goiter, partially imaged, previously assessed by thyroid ultrasound. Electronically Signed   By: Eddie Candle M.D.   On: 12/17/2019 14:42   US RENAL  Result Date: 12/22/2019 CLINICAL DATA:  Acute kidney injury. EXAM: RENAL / URINARY TRACT ULTRASOUND  COMPLETE COMPARISON:  None. FINDINGS: Right Kidney: Renal measurements: 10.2 x 4.8 x 5.7 cm = volume: 147 mL. Increased echogenicity. 1.1 cm shadowing calculus. No mass or hydronephrosis visualized. Left Kidney: Renal measurements: 11.1 x 5.4 x 4.3 cm =  volume: 136 mL. Increased echogenicity. No mass or hydronephrosis visualized. Bladder: Appears normal for degree of bladder distention. Other: None. IMPRESSION: 1. Bilateral increased renal echogenicity, consistent with medical renal disease. 2. Nonobstructive right nephrolithiasis. Electronically Signed   By: Titus Dubin M.D.   On: 01/05/2020 11:39   DG Chest Port 1 View  Result Date: 12/07/2019 CLINICAL DATA:  Respiratory failure EXAM: PORTABLE CHEST 1 VIEW COMPARISON:  12/17/2019 FINDINGS: Tracheostomy in good position. NG tube enters the stomach. Right subclavian central venous catheter tip in the SVC unchanged. Bibasilar airspace disease unchanged. No pneumothorax or pleural effusion. Extensive subcutaneous gas is present in the chest bilaterally which appears slightly improved. IMPRESSION: Bibasilar airspace disease unchanged.  Possible pneumonia. No pneumothorax.  Mild improvement in subcutaneous emphysema. Electronically Signed   By: Franchot Gallo M.D.   On: 12/14/2019 08:35   DG CHEST PORT 1 VIEW  Result Date: 12/17/2019 CLINICAL DATA:  69 year old female with pneumomediastinum and extensive subcutaneous emphysema. Underlying COPD, acute on chronic respiratory failure. Status post tracheostomy. EXAM: PORTABLE CHEST 1 VIEW COMPARISON:  Chest CT 1415 hours today. Portable chest 1119 hours today. FINDINGS: Portable AP semi upright view at 1901 hours. Tracheostomy tube, visible enteric tube and right PICC line appears stable. Left and central chest pacer or resuscitation pads now in place. Stable lung volumes. Stable cardiac size and mediastinal contours. Pneumomediastinum better demonstrated by CT. No pneumothorax is evident. Bibasilar reticulonodular opacity appears mildly progressed from this morning and is concordant with the CT appearance of lower lobe airspace disease. Upper lungs remain clear. No new pulmonary abnormality is evident. IMPRESSION: 1.  Stable lines and tubes. 2. Severe  subcutaneous emphysema with no pneumothorax evident. Pneumomediastinum better demonstrated by CT today. 3. Bibasilar airspace opacity compatible with aspiration and/or pneumonia as on CT today. No new pulmonary abnormality identified. Electronically Signed   By: Genevie Ann M.D.   On: 12/17/2019 19:24   DG Chest Port 1 View  Result Date: 12/17/2019 CLINICAL DATA:  Pneumothorax, NG tube placement EXAM: PORTABLE CHEST 1 VIEW COMPARISON:  12/16/2019 FINDINGS: Severe diffuse subcutaneous emphysema, new since prior study. Small amount of air noted adjacent to the left heart border could reflect a small medial pneumothorax or pneumomediastinum. No other definite visible pneumothorax. Hyperinflation/COPD. Heart is normal size. Patchy bilateral lower lobe opacities. Tracheostomy and right PICC line remain in place, unchanged. NG tube tip is in the distal esophagus. IMPRESSION: NG tube tip in the distal esophagus. This could be advanced several cm. New extensive subcutaneous emphysema. Small amount of gas along the left heart border could reflect small medial pneumothorax or pneumomediastinum. COPD. Bibasilar atelectasis or infiltrates. Electronically Signed   By: Rolm Baptise M.D.   On: 12/17/2019 11:34   DG Abd Portable 1V  Result Date: 12/17/2019 CLINICAL DATA:  Nasogastric tube placement EXAM: PORTABLE ABDOMEN - 1 VIEW COMPARISON:  10:10 p.m. FINDINGS: Since the prior examination, the nasogastric tube has been advanced and its proximal side hole is now seen within the expected proximal body of the stomach just beyond the gastroesophageal junction. Extensive subcutaneous gas is seen throughout the chest and abdominal wall, similar to that noted on prior examination. Underpenetration precludes evaluation of the abdominal gas pattern. IMPRESSION: Proximal side-hole of the nasogastric tube  is now positioned within the proximal body of the stomach. Electronically Signed   By: Fidela Salisbury MD   On: 12/17/2019 23:12    DG Abd Portable 1V  Result Date: 12/17/2019 CLINICAL DATA:  Confirm nasogastric tube placement. EXAM: PORTABLE ABDOMEN - 1 VIEW COMPARISON:  Radiograph earlier this day.  Chest CT earlier today. FINDINGS: Tip of the enteric tube remains in the region of the gastroesophageal junction, side-port in the distal esophagus. Recommend advancement of at least 5 cm for optimal placement. Pneumomediastinum and subcutaneous emphysema as seen on chest CT earlier today. IMPRESSION: Tip of the enteric tube remains in the region of the gastroesophageal junction, side-port in the distal esophagus. Recommend advancement of at least 5 cm for optimal placement. Electronically Signed   By: Keith Rake M.D.   On: 12/17/2019 22:17   DG Abd Portable 1V  Result Date: 12/17/2019 CLINICAL DATA:  Nasogastric tube placement. EXAM: PORTABLE ABDOMEN - 1 VIEW COMPARISON:  None. FINDINGS: The bowel gas pattern is normal. Nasogastric tube tip is seen in distal esophagus. Extensive subcutaneous emphysema is seen involving the chest which extends down the right lateral abdominal wall. No radio-opaque calculi or other significant radiographic abnormality are seen. IMPRESSION: Nasogastric tube tip seen in distal esophagus; advancement is recommended. Extensive subcutaneous emphysema is seen involving the chest which extends down the right lateral abdominal wall. Electronically Signed   By: Marijo Conception M.D.   On: 12/17/2019 11:33   US THYROID  Result Date: 12/16/2019 CLINICAL DATA:  Large palpable right-sided neck mass. EXAM: THYROID ULTRASOUND TECHNIQUE: Ultrasound examination of the thyroid gland and adjacent soft tissues was performed. COMPARISON:  None. FINDINGS: Parenchymal Echotexture: Markedly heterogenous Isthmus: 3.4 cm Right lobe: 13.8 x 8.2 x 9.2 cm Left lobe: 8.5 x 4.5 x 5.3 cm _________________________________________________________ Estimated total number of nodules >/= 1 cm: 0 Number of spongiform nodules >/=  2 cm  not described below (TR1): 0 Number of mixed cystic and solid nodules >/= 1.5 cm not described below (TR2): 0 _________________________________________________________ Evaluation is limited by patient condition. The thyroid gland appears to be diffusely enlarged bilaterally, right worse than left. Multiple calcifications are noted within the thyroid gland. Normal appearing thyroid tissue is difficult to appreciate on this study. IMPRESSION: 1. Limited study as detailed above. 2. There appears to be massive enlargement of the thyroid gland as detailed above. The exact size is difficult to appreciate on this study. Follow-up with a contrast enhanced CT would be useful for further size characterization. Additionally, very little normal appearing thyroid tissue was identified. As such, a contrast enhanced CT would be useful to confirm the visualized mass is indeed related to the thyroid gland. The above is in keeping with the ACR TI-RADS recommendations - J Am Coll Radiol 2017;14:587-595. Electronically Signed   By: Constance Holster M.D.   On: 12/16/2019 18:02    Disposition:    Discharge disposition: [place] Not Defined     [facility]  Accepting MD, Dr. Owens Shark, updated by phone.   Discharge Instructions    Discharge wound care:   Complete by: As directed    Per [facility] wound care      Allergies as of 12/10/2019   Not on File     Medication List    STOP taking these medications   Combivent Respimat 20-100 MCG/ACT Aers respimat Generic drug: Ipratropium-Albuterol Replaced by: ipratropium-albuterol 0.5-2.5 (3) MG/3ML Soln   Dilt-XR 180 MG 24 hr capsule Generic drug: diltiazem  Replaced by: diltiazem 10 mg/ml  oral suspension   Ventolin HFA 108 (90 Base) MCG/ACT inhaler Generic drug: albuterol Replaced by: albuterol (2.5 MG/3ML) 0.083% nebulizer solution     TAKE these medications   albuterol (2.5 MG/3ML) 0.083% nebulizer solution Commonly  known as: PROVENTIL Take 3 mLs (2.5 mg total) by nebulization every 4 (four) hours as needed for wheezing. Replaces: Ventolin HFA 108 (90 Base) MCG/ACT inhaler   arformoterol 15 MCG/2ML Nebu Commonly known as: BROVANA Take 2 mLs (15 mcg total) by nebulization 2 (two) times daily.   budesonide 0.5 MG/2ML nebulizer solution Commonly known as: PULMICORT Take 2 mLs (0.5 mg total) by nebulization 2 (two) times daily.   diltiazem 10 mg/ml  oral suspension Commonly known as: CARDIZEM Place 3 mLs (30 mg total) into feeding tube every 8 (eight) hours. Replaces: Dilt-XR 180 MG 24 hr capsule   ipratropium-albuterol 0.5-2.5 (3) MG/3ML Soln Commonly known as: DUONEB Take 3 mLs by nebulization every 6 (six) hours. Replaces: Combivent Respimat 20-100 MCG/ACT Aers respimat   montelukast 10 MG tablet Commonly known as: SINGULAIR Take 10 mg by mouth at bedtime.            Discharge Care Instructions  (From admission, onward)         Start     Ordered   12/06/2019 0000  Discharge wound care:       Comments: Per [facility] wound care   01/03/2020 1210           Follow-up appointment    Discharge Condition:    stable    Kennieth Rad, MSN, AGACNP-BC [facility] 12/16/2019, 1:04 PM  See Amion for personal pager PCCM on call pager [phone]

## 2019-12-18 NOTE — Progress Notes (Signed)
[facility] Progress Note Patient Name: Shelley Nicholson DOB: 04/24/1951 MRN: [id number]   Date of Service  12/21/2019  HPI/Events of Note  Oliguria - Urine output = 25 mL for last 6 hours.   eICU Interventions  Plan: 1. Bolus with 0.9 NaCl 500 mL IV over 30 minutes.  2. 0.9 NaCl to run IV at 75 mL/hour.      Intervention Category Major Interventions: Other:  Lysle Dingwall 12/29/2019, 12:05 AM

## 2019-12-18 NOTE — Progress Notes (Addendum)
CSW spoke with [place] at [place] who states this patient can return. CSW informed [employer] of information for discharge.  The patient will go to room 5E23. The number to call report is [phone]. The receiving physician is Dr. Owens Shark.  Madilyn Fireman, MSW, LCSW-A Transitions of Care  Clinical Social Worker  [facility] Emergency Departments  Medical ICU [phone]

## 2019-12-18 NOTE — Progress Notes (Signed)
Initial Nutrition Assessment  DOCUMENTATION CODES:   Not applicable  INTERVENTION:   Initiate tube feeding via NG tube: Vital AF 1.2 at 45 ml/h (1080 ml per day)  Provides 1296 kcal, 81 gm protein, 876 ml free water daily  NUTRITION DIAGNOSIS:   Inadequate oral intake related to inability to eat as evidenced by NPO status.  GOAL:   Patient will meet greater than or equal to 90% of their needs  MONITOR:   Vent status, Labs, TF tolerance, Skin  REASON FOR ASSESSMENT:   Consult Enteral/tube feeding initiation and management  ASSESSMENT:   69 yo female admitted with COPD exacerbation. PMH includes COPD, goiter, HTN, PAF.  Discussed patient in ICU rounds and with RN today. Patient was transferred in from [facility], and may transfer back today.  S/P tracheostomy 7/12 by ENT. Subcutaneous emphysema. Current weight 71.4 kg, very volume overloaded, suspect usual weight is much lower.   Patient is currently intubated on ventilator support MV: 5.4 L/min Temp (24hrs), Avg:96.9 F (36.1 C), Min:94 F (34.4 C), Max:99 F (37.2 C)  Propofol: off  Labs reviewed. BUN 150, Creat 1.43, triglycerides 158 CBG: 90-78  Medications reviewed and include colace, novolog, miralax.   NUTRITION - FOCUSED PHYSICAL EXAM:    Most Recent Value  Orbital Region No depletion  Upper Arm Region No depletion  Thoracic and Lumbar Region No depletion  Buccal Region No depletion  Temple Region No depletion  Clavicle Bone Region No depletion  Clavicle and Acromion Bone Region No depletion  Scapular Bone Region No depletion  Dorsal Hand No depletion  Patellar Region No depletion  Anterior Thigh Region No depletion  Posterior Calf Region No depletion  Edema (RD Assessment) Severe  Hair Reviewed  Eyes Unable to assess  Mouth Unable to assess  Skin Reviewed  Nails Reviewed       Diet Order:   Diet Order            Diet NPO time specified  Diet effective now                  EDUCATION NEEDS:   Not appropriate for education at this time  Skin:  Skin Assessment: Skin Integrity Issues: Skin Integrity Issues:: Stage II Stage II: sacrum  Last BM:  7/12  Height:   Ht Readings from Last 1 Encounters:  12/17/19 5\' 1"  (1.549 m)    Weight:   Wt Readings from Last 1 Encounters:  12/12/2019 71.4 kg    Ideal Body Weight:  47.7 kg  BMI:  Body mass index is 29.74 kg/m.  Estimated Nutritional Needs:   Kcal:  9292-4462  Protein:  75-85 gm  Fluid:  >/= 1.4 L    Lucas Mallow, RD, LDN, CNSC Please refer to Amion for contact information.

## 2019-12-18 NOTE — Progress Notes (Addendum)
NAMEPailyn Nicholson, MRN:  [id number], DOB:  August 24, 1950, LOS: 1 ADMISSION DATE:  12/17/2019, CONSULTATION DATE:  12/17/19 REFERRING MD:  Shelley Nicholson, CHIEF COMPLAINT:  Chronic respiratory failure  Brief History   68 yof with COPD and a goiter who was initially intubated 6/17 for a COPD exacerbation and has been unable to be weaned off the ventilator since. Underwent tracheostomy 12/17/19 by ENT. Follow up imaging showing subcutaneous emphysema and PCCM consulted for ongoing management.  History of present illness   58 yof with COPD, goiter, HTN, paroxysmal afib who initially was admitted to [facility] critical care unit for severe COPD exacerbation on 6/17. She ultimately required intubation on the day of admission. For an unclear reason, her chart indicates she was extubated that same day however required re-intubation due to progressive respiratory failure. Sputum cultures were + for haemophilus which was treated with rocephin. She was unable to be weaned off the vent and was ultimately [facility].   Past Medical History  COPD, goiter, HTN, paroxysmal afib   Significant Hospital Events   7/12 tracheostomy, admission  Consults:  ENT  Procedures:  pta RUE PICC >> 7/12 tracheostomy XLT 60 >> changed to brovonna  7/12 left radial aline  Significant Diagnostic Tests:  7/12 CXR>>new extensive subcutaneous emphysema. Small amount of gas along the left heart boarder that could reflect small medial pneumothorax or penumomediastinum. Bibasilar atelectasis or infiltrates. 7/12 abd xray>>subcut gas seen extending down the right lateral abdominal wall  7/12 CT chest>>extensive pneumomediastinum and severe, very extensive subcutaneous emphysema about the chest, neck, and upper abdomen. No pneumothorax. Clustered heterogenous airspace opacities in the bilateral lung spaces consistent with aspiration. Severe emphysema  Micro Data:  7/11 tracheal aspirate>>NGTD 7/12 BCx2  >>  Antimicrobials:  7/12 ancef >>  Interim history/subjective:  Went back to OR last evening for trach change from shiley 6 prox XLT to brovanna Remains sedated on fentanyl/ propofol Intermittent air leak from trach Currently off levophed Remains oliguric  CVP 15 afebrile  Objective   Blood pressure (!) 111/46, pulse 97, temperature 97.7 F (36.5 C), resp. rate 20, weight 71.4 kg, SpO2 96 %. CVP:  [10 mmHg] 10 mmHg  Vent Mode: PRVC FiO2 (%):  [50 %-100 %] 50 % Set Rate:  [18 bmp-28 bmp] 20 bmp Vt Set:  [280 mL-380 mL] 380 mL PEEP:  [5 cmH20] 5 cmH20 Plateau Pressure:  [24 cmH20-25 cmH20] 24 cmH20   Intake/Output Summary (Last 24 hours) at 01/01/2020 0813 Last data filed at 12/28/2019 0700 Gross per 24 hour  Intake 2039.81 ml  Output 195 ml  Net 1844.81 ml   Filed Weights   12/17/19 1810 12/17/19 2200 12/09/2019 0419  Weight: 50.3 kg 70.8 kg 71.4 kg    Examination: on fentanyl 50 mcg/hr/ propofol 25 mcg/kg/min General:  Critically ill appearing female sedate on trach/ mechanical ventilation HEENT: MM pink/moist, pupils 3/reactive, scleral edema, improving facial subcutaneous emphysema, midline brovanna trach, left nare NGT- no active bleeding Neuro: sedated, currently no response to noxious stimuli CV: remains SR, intermittent afib overnight rate controlled PULM:  Non labored on MV, getting good TV/ MVe, intermittent cuff leak- cuff pressures low, improved with added air, good air movement- no wheeze, still diffuse chest wall/ arm subcutaneous air with some improvement since yesterday, minimal secretions GI: obese, +bs, foley Extremities: warm/dry,4+ pitting edema up to chest/ arms Skin: scattered bruising to arms, weeping areas to arms  Resolved Hospital Problem list   n/a  Assessment & Plan:  Pneumomediastinum with diffuse subcutaneous emphysema post tracheostomy placement - thought related to tracheal disruption during initial procedure, s/p trach exchange 7/12 PM  to [facility] Per ENT Intermittent air leak this am- cuff pressures were low, improved with air placement No worsening subcu emphysema, will slowly resolve, continue to monitor  Chronic hypoxic and hypercapnic respiratory failure s/p tracheostomy COPD Plan CXR this am Intermittent ABG Full MV support PRVC currently at 8 cc/kg, rate 20 Weaning FiO2 as able for sat goal > 88% VAP bundling/ PPI duonebs q 6 and prn albuterol, Pulmicort, brovanna  PAD protocol- will try to minimize sedation, change from propofol to precedex, attempt to wean off fentanyl gtt as able to prn, RASS goal 0/-1 with bowel regimen Daily SBT/ WUA Will consult IR for PEG  Hypotension - resolved at present Plan MAP goal >65, currently off levophed Trending CVP- currently at 15 Hgb trend overall remains stable Noted mild left shift with leukocytosis 7/12, improved leukocytosis today Ancef per pharmacy  Follow culture data (including trach asp from [facility] 7/11) Trend WBC/ fever curve   AKI/ uremia with anasarca - noted progressive worsening of BUN/ sCr since 7/8 - on chart review- appears to have been on propofol since 6/28, questioning if contributing to AKI - oliguric Plan UA, renal ultrasound to r/o obstruction, and FEUr (previously on diuretics at [facility]) Check CK, LA, LDH Lasix now with KCL  Continue foley  Strict I/Os/ trending renal indices   Paroxysmal atrial fibrillation Plan Tele monitoring, currently SR, occasion afib overnight Cardizem as below  Hypertension Plan Restart reduced Cardizem 30mg  q 6hr with holding parameters, given hypotension overnight, currently hypertensive off sedation  Prn apresoline  T2DM- controlled Plan SSI mod  Protein calorie malnutrition Plan Resume TF per RD recs  Best practice:  Diet: NPO. Resume TF per RD recs  Pain/Anxiety/Delirium protocol (if indicated):as above VAP protocol (if indicated): yes DVT prophylaxis: heparin SQ GI prophylaxis:  pepcid Glucose control: SSI Mobility: BR Code Status: Full Family Communication: pending Disposition: ICU  Labs   CBC: Recent Labs  Lab 12/14/19 1449 12/14/19 1449 12/16/19 0847 12/16/19 1704 12/17/19 1919 12/17/19 2231 12/17/2019 0435  WBC 15.2*  --  18.7* 17.3* 18.7*  --  16.5*  NEUTROABS  --   --   --   --  16.9*  --   --   HGB 8.3*   < > 7.9* 9.6* 10.5* 9.5* 9.6*  HCT 26.7*   < > 25.5* 30.6* 34.0* 28.0* 31.1*  MCV 70.3*  --  71.6* 76.3* 77.8*  --  77.8*  PLT 159  --  257 PLATELET CLUMPS NOTED ON SMEAR, COUNT APPEARS ADEQUATE 248  --  248   < > = values in this interval not displayed.    Basic Metabolic Panel: Recent Labs  Lab 12/13/19 1016 12/13/19 1016 12/14/19 1449 12/14/19 1449 12/16/19 0847 12/16/19 1138 12/17/19 1919 12/17/19 2231 12/14/2019 0322  NA 139   < > 141  --  137  --  134* 136 138  K 5.6*   < > 4.1   < > 3.2* 3.1* 3.8 3.3* 3.8  CL 96*  --  95*  --  89*  --  90*  --  93*  CO2 35*  --  31  --  32  --  31  --  31  GLUCOSE 280*  --  216*  --  232*  --  155*  --  97  BUN 124*  --  133*  --  147*  --  154*  --  150*  CREATININE 1.19*  --  1.28*  --  1.37*  --  1.45*  --  1.43*  CALCIUM 8.3*  --  8.1*  --  8.4*  --  8.2*  --  8.3*   < > = values in this interval not displayed.   GFR: Estimated Creatinine Clearance: 33.1 mL/min (A) (by C-G formula based on SCr of 1.43 mg/dL (H)). Recent Labs  Lab 12/16/19 0847 12/16/19 1704 12/17/19 1919 01/01/2020 0435  WBC 18.7* 17.3* 18.7* 16.5*    Liver Function Tests: Recent Labs  Lab 12/17/19 1919  AST 51*  ALT 111*  ALKPHOS 97  BILITOT 0.9  PROT 4.9*  ALBUMIN 2.3*   No results for input(s): LIPASE, AMYLASE in the last 168 hours. No results for input(s): AMMONIA in the last 168 hours.  ABG    Component Value Date/Time   PHART 7.363 12/17/2019 2231   PCO2ART 60.5 (H) 12/17/2019 2231   PO2ART 324 (H) 12/17/2019 2231   HCO3 35.4 (H) 12/17/2019 2231   TCO2 37 (H) 12/17/2019 2231   O2SAT 100.0  12/17/2019 2231     Coagulation Profile: Recent Labs  Lab 12/17/19 2311  INR 1.0    Cardiac Enzymes: No results for input(s): CKTOTAL, CKMB, CKMBINDEX, TROPONINI in the last 168 hours.  HbA1C: Hgb A1c MFr Bld  Date/Time Value Ref Range Status  11/29/2019 04:44 PM 7.0 (H) 4.8 - 5.6 % Final    Comment:    (NOTE) Pre diabetes:          5.7%-6.4%  Diabetes:              >6.4%  Glycemic control for   <7.0% adults with diabetes     CBG: Recent Labs  Lab 12/17/19 2049 12/17/19 2325 12/24/2019 0328 12/17/2019 0740  GLUCAP 151* 143* 90 78    CCT: 59 mins  Kennieth Rad, MSN, AGACNP-BC [facility] 12/26/2019, 8:13 AM  See Shea Evans for personal pager PCCM on call pager [phone]   PCCM:  69 yo FM, s/p tracheostomy, developed pneumomediastinum and subq emphysema. Had a trach exchange in or last night. Now stable in icu off sedation and pressure support ventilation. Severe copd baseline.   BP (!) 117/57   Pulse 97   Temp 97.7 F (36.5 C)   Resp 20   Wt 71.4 kg   SpO2 95%   BMI 29.74 kg/m   Gen: chronically ill appearing  HENT: subq air in face, trach in place  Heart: RRR, s1 s2 Lungs: no crackles, no wheeze, bl vented breaths Abd: soft, edema in flanks Ext: 2+ edema   Labs: reviewed   A:  Pneumomediastinum  subq air, stable, cxr stable  Chronic hypoxic resp failure  S/p trach  Hypotension- resolved  AKI, appears volume overloaded   P: Off sedation  In pressure support ventilation  Needs diuresis  Check renal ultrasound and echo Check [facility]  Could go as soon as this afternoon or tomorrow Ccm team will discuss with ENT  She probably needs a PEG tube  Can consult IR  Start tfs   This patient is critically ill with multiple organ system failure; which, requires frequent high complexity decision making, assessment, support, evaluation, and titration of therapies. This was completed through  the application of advanced monitoring technologies and extensive interpretation of multiple databases. During this encounter critical care time was devoted to patient care  services described in this note for 36 minutes.  [facility] 12/26/2019 10:34 AM

## 2019-12-19 ENCOUNTER — Other Ambulatory Visit (HOSPITAL_COMMUNITY): Payer: Self-pay

## 2019-12-19 LAB — CBC
HCT: 31.5 % — ABNORMAL LOW (ref 36.0–46.0)
Hemoglobin: 9.6 g/dL — ABNORMAL LOW (ref 12.0–15.0)
MCH: 23.5 pg — ABNORMAL LOW (ref 26.0–34.0)
MCHC: 30.5 g/dL (ref 30.0–36.0)
MCV: 77.2 fL — ABNORMAL LOW (ref 80.0–100.0)
Platelets: 227 10*3/uL (ref 150–400)
RBC: 4.08 MIL/uL (ref 3.87–5.11)
WBC: 13.9 10*3/uL — ABNORMAL HIGH (ref 4.0–10.5)
nRBC: 0.3 % — ABNORMAL HIGH (ref 0.0–0.2)

## 2019-12-19 LAB — COMPREHENSIVE METABOLIC PANEL
ALT: 31 U/L (ref 0–44)
AST: 63 U/L — ABNORMAL HIGH (ref 15–41)
Albumin: 1.8 g/dL — ABNORMAL LOW (ref 3.5–5.0)
Alkaline Phosphatase: 77 U/L (ref 38–126)
Anion gap: 20 — ABNORMAL HIGH (ref 5–15)
BUN: 150 mg/dL — ABNORMAL HIGH (ref 8–23)
CO2: 25 mmol/L (ref 22–32)
Calcium: 8.4 mg/dL — ABNORMAL LOW (ref 8.9–10.3)
Chloride: 94 mmol/L — ABNORMAL LOW (ref 98–111)
Creatinine, Ser: 1.51 mg/dL — ABNORMAL HIGH (ref 0.44–1.00)
GFR calc Af Amer: 40 mL/min — ABNORMAL LOW (ref >60)
GFR calc non Af Amer: 35 mL/min — ABNORMAL LOW (ref >60)
Glucose, Bld: 30 mg/dL — CL (ref 70–99)
Potassium: 3.8 mmol/L (ref 3.5–5.1)
Sodium: 139 mmol/L (ref 135–145)
Total Bilirubin: 0.9 mg/dL (ref 0.3–1.2)
Total Protein: 4.7 g/dL — ABNORMAL LOW (ref 6.5–8.1)

## 2019-12-19 LAB — CULTURE, RESPIRATORY W GRAM STAIN

## 2019-12-20 NOTE — Anesthesia Postprocedure Evaluation (Addendum)
Anesthesia Post Note  Patient: Shelley Nicholson  Procedure(s) Performed: TRACHEOSTOMY (N/A Neck)     Patient location during evaluation: Other Anesthesia Type: General Level of consciousness: sedated Pain management: pain level controlled Vital Signs Assessment: post-procedure vital signs reviewed and stable Respiratory status: patient remains intubated per anesthesia plan Cardiovascular status: stable Postop Assessment: no apparent nausea or vomiting Anesthetic complications: no                 Briyah Wheelwright

## 2019-12-22 LAB — CULTURE, BLOOD (ROUTINE X 2)
Culture: NO GROWTH
Culture: NO GROWTH

## 2020-01-06 NOTE — Progress Notes (Signed)
Request to IR for temporary HD catheter placement due to oliguric AKI.   A CT abd w/o contrast was ordered by this writer yesterday after request was made for assessment by [facility] critical care for possible g-tube placement. This order was not carried over when patient was transferred to [facility] (if g-tube placement is still desired by [facility] please place a new order so this can be evaluated by IR physician who will perform the procedure). CT was reviewed today by Dr. Anselm Pancoast who notes distention of the urinary bladder with contrast despite Foley catheter in place with last contrast administration approximately 23 hours prior to this CT scan (CT chest with contrast 12/17/19 is last contrast enhanced exam per chart).  Discussed with patient's RN as well as Dr. Laren Everts who will assess appropriate placement of Foley to ensure that urine output cannot be optimized prior to placement of HD catheter/initiation of HD. Per phone conversation with Dr. Laren Everts this afternoon at 14:21 will hold on temporary HD catheter placement today and will follow up with their service tomorrow to decide if we will be proceeding with HD catheter placement.   Please call IR with questions or concerns.  Candiss Norse, PA-C

## 2020-01-06 NOTE — Consult Note (Signed)
[facility] CONSULT NOTE    Date: 01-17-20                  Patient Name:  Shelley Nicholson  MRN: [id number]  DOB: June 13, 1950  Age / Sex: 69 y.o., female         PCP: Veatrice Bourbon, MD                 Service Requesting Consult:  Hospitalist                 Reason for Consult:  Acute kidney injury            History of Present Illness: Patient is a 69 y.o. female with a PMHx of chronic severe COPD, acute on chronic respiratory failure with failure to wean,, history of goiter, hypertension, paroxysmal atrial fibrillation who was admitted to [facility] 01/03/2020 for ongoing treatment of acute on chronic respiratory failure, pneumomediastinum, acute kidney injury.  The patient recently underwent tracheostomy placement on 12/17/2019.  Postprocedure she developed severe subcutaneous emphysema and extensive pneumomediastinum.  She was then transition to [facility].  She returned to the OR on 12/17/2019 for tracheostomy tube exchange.  She was stabilized and then sent back to [facility] for ongoing care.  We are asked to see her for evaluation management of acute kidney injury.  She now has documented oliguria.  Her BUN is up to 150 with a creatinine of 1.43.  She is maintained on the ventilator and has significant facial swelling as well as subcutaneous crepitus along her upper chest wall.   Medications: Outpatient medications: Medications Prior to Admission  Medication Sig Dispense Refill Last Dose  . albuterol (PROVENTIL) (2.5 MG/3ML) 0.083% nebulizer solution Take 3 mLs (2.5 mg total) by nebulization every 4 (four) hours as needed for wheezing. 75 mL 12   . arformoterol (BROVANA) 15 MCG/2ML NEBU Take 2 mLs (15 mcg total) by nebulization 2 (two) times daily. 120 mL    . budesonide (PULMICORT) 0.5 MG/2ML nebulizer solution Take 2 mLs (0.5 mg total) by nebulization 2 (two) times daily.  12   . diltiazem (CARDIZEM) 10 mg/ml oral suspension Place 3 mLs (30  mg total) into feeding tube every 8 (eight) hours.     Marland Kitchen ipratropium-albuterol (DUONEB) 0.5-2.5 (3) MG/3ML SOLN Take 3 mLs by nebulization every 6 (six) hours. 360 mL    . montelukast (SINGULAIR) 10 MG tablet Take 10 mg by mouth at bedtime.       Current medications: Budesonide 0.5 mg inhaled twice daily, diltiazem 30 mg 3 times daily, ferrous sulfate 300 mg daily, ipratropium albuterol 3 mL inhaled every 6 hours, Lantus insulin 10 units nightly, montelukast 10 mg nightly, Protonix 40 mg twice daily, senna 8.6 mg twice daily, multivitamin 1 tablet daily, Flomax 0.4 mg daily, thiamine 100 mg daily   Allergies: No known drug allergies    Past Medical History: Past Medical History:  Diagnosis Date  . Acute on chronic respiratory failure with hypoxia ([place])   . COPD, severe ([place])   . Lobar pneumonia, unspecified organism ([place])   . Ventilator dependence [facility])      Past Surgical History: Past Surgical History:  Procedure Laterality Date  . TRACHEOSTOMY TUBE PLACEMENT N/A 12/17/2019   Procedure: TRACHEOSTOMY;  Surgeon: Rozetta Nunnery, MD;  Location: [facility] OR;  Service: ENT;  Laterality: N/A;  . TRACHEOSTOMY TUBE PLACEMENT N/A 12/17/2019   Procedure: TRACHEOSTOMY EXCHANGE;  Surgeon: Rozetta Nunnery, MD;  Location:  [facility] OR;  Service: ENT;  Laterality: N/A;     Family History: Unable to obtain from patient as she is currently on a ventilator  Social History: Social History   Socioeconomic History  . Marital status: Widowed    Spouse name: Not on file  . Number of children: Not on file  . Years of education: Not on file  . Highest education level: Not on file  Occupational History  . Not on file  Tobacco Use  . Smoking status: Not on file  Substance and Sexual Activity  . Alcohol use: Not on file  . Drug use: Not on file  . Sexual activity: Not on file  Other Topics Concern  . Not on file  Social History Narrative  . Not on file   Social Determinants of Health    Financial Resource Strain:   . Difficulty of Paying Living Expenses:   Food Insecurity:   . Worried About Charity fundraiser in the Last Year:   . Arboriculturist in the Last Year:   Transportation Needs:   . Film/video editor (Medical):   Marland Kitchen Lack of Transportation (Non-Medical):   Physical Activity:   . Days of Exercise per Week:   . Minutes of Exercise per Session:   Stress:   . Feeling of Stress :   Social Connections:   . Frequency of Communication with Friends and Family:   . Frequency of Social Gatherings with Friends and Family:   . Attends Religious Services:   . Active Member of Clubs or Organizations:   . Attends Archivist Meetings:   Marland Kitchen Marital Status:   Intimate Partner Violence:   . Fear of Current or Ex-Partner:   . Emotionally Abused:   Marland Kitchen Physically Abused:   . Sexually Abused:      Review of Systems: Unable to obtain from the patient and she is currently on the ventilator  Vital Signs: Temperature 96.1 pulse 89 respirations 22 blood pressure 122/55  Weight trends: There were no vitals filed for this visit.  Physical Exam: General: Critically ill-appearing  Head: Facial swelling noted  Eyes: Eyes closed  Nose: Mucous membranes moist, not inflammed, nonerythematous.  Throat: Unable to visualize pharynx  Neck: Tracheostomy in place  Lungs:  Bilateral rales and rhonchi, vent assisted  Heart: S1S2 no rubs  Abdomen:  BS normoactive. Soft, Nondistended, non-tender.  No masses or organomegaly.  Extremities: 3+ lower extremity edema  Neurologic: Intubated, not following commands  Skin: Significant neck and chest wall subcutaneous crepitus    Lab results: Basic Metabolic Panel: Recent Labs  Lab 12/16/19 0847 12/16/19 1138 12/17/19 1919 12/17/19 2231 12/16/2019 0322  NA 137   < > 134* 136 138  K 3.2*   < > 3.8 3.3* 3.8  CL 89*  --  90*  --  93*  CO2 32  --  31  --  31  GLUCOSE 232*  --  155*  --  97  BUN 147*  --  154*  --  150*   CREATININE 1.37*  --  1.45*  --  1.43*  CALCIUM 8.4*  --  8.2*  --  8.3*   < > = values in this interval not displayed.    Liver Function Tests: Recent Labs  Lab 12/17/19 1919  AST 51*  ALT 111*  ALKPHOS 97  BILITOT 0.9  PROT 4.9*  ALBUMIN 2.3*   No results for input(s): LIPASE, AMYLASE in the last 168 hours.  No results for input(s): AMMONIA in the last 168 hours.  CBC: Recent Labs  Lab 12/16/19 0847 12/16/19 0847 12/16/19 1704 12/16/19 1704 12/17/19 1919 12/17/19 1919 12/17/19 2231 12/29/2019 0435 Jan 05, 2020 0718  WBC 18.7*   < > 17.3*   < > 18.7*  --   --  16.5* 13.9*  NEUTROABS  --   --   --   --  16.9*  --   --   --   --   HGB 7.9*   < > 9.6*   < > 10.5*   < > 9.5* 9.6* 9.6*  HCT 25.5*   < > 30.6*   < > 34.0*   < > 28.0* 31.1* 31.5*  MCV 71.6*  --  76.3*  --  77.8*  --   --  77.8* 77.2*  PLT 257   < > PLATELET CLUMPS NOTED ON SMEAR, COUNT APPEARS ADEQUATE   < > 248  --   --  248 227   < > = values in this interval not displayed.    Cardiac Enzymes: Recent Labs  Lab 12/26/2019 1036  CKTOTAL 731*    BNP: Invalid input(s): POCBNP  CBG: Recent Labs  Lab 12/17/19 2049 12/17/19 2325 12/06/2019 0328 12/16/2019 0740 12/08/2019 1124  GLUCAP 151* 143* 90 69 104*    Microbiology: Results for orders placed or performed during the hospital encounter of 12/17/19  Culture, blood (routine x 2)     Status: None (Preliminary result)   Collection Time: 12/17/19  7:19 PM   Specimen: BLOOD  Result Value Ref Range Status   Specimen Description BLOOD LEFT FOOT  Final   Special Requests   Final    BOTTLES DRAWN AEROBIC ONLY Blood Culture results may not be optimal due to an inadequate volume of blood received in culture bottles   Culture   Final    NO GROWTH 2 DAYS Performed at [facility], [place] [address] [place], [place] [address]    Report Status PENDING  Incomplete  MRSA PCR Screening     Status: None   Collection Time: 12/17/19  8:53 PM   Specimen:  Nasopharyngeal  Result Value Ref Range Status   MRSA by PCR NEGATIVE NEGATIVE Final    Comment:        The GeneXpert MRSA Assay (FDA approved for NASAL specimens only), is one component of a comprehensive MRSA colonization surveillance program. It is not intended to diagnose MRSA infection nor to guide or monitor treatment for MRSA infections. Performed at [facility], [place] [address], [place], [place] [address]   Culture, blood (routine x 2)     Status: None (Preliminary result)   Collection Time: 12/17/19  9:09 PM   Specimen: BLOOD  Result Value Ref Range Status   Specimen Description BLOOD SITE NOT SPECIFIED  Final   Special Requests   Final    BOTTLES DRAWN AEROBIC ONLY Blood Culture results may not be optimal due to an excessive volume of blood received in culture bottles   Culture   Final    NO GROWTH 2 DAYS Performed at [facility], [place] [address], [place], [place] [address]    Report Status PENDING  Incomplete    Coagulation Studies: Recent Labs    12/17/19 2311  LABPROT 13.1  INR 1.0    Urinalysis: No results for input(s): COLORURINE, LABSPEC, PHURINE, GLUCOSEU, HGBUR, BILIRUBINUR, KETONESUR, PROTEINUR, UROBILINOGEN, NITRITE, LEUKOCYTESUR in the last 72 hours.  Invalid input(s): APPERANCEUR  Imaging: DG Abd 1 View  Result Date: 12/17/2019 CLINICAL DATA:  Nasogastric tube placement. EXAM: ABDOMEN - 1 VIEW COMPARISON:  Same day. FINDINGS: The bowel gas pattern is normal. Distal tip of nasogastric tube is seen in the expected position of the gastroesophageal junction. No radio-opaque calculi or other significant radiographic abnormality are seen. IMPRESSION: Distal tip of nasogastric tube seen in expected position of gastroesophageal junction. Advancement is recommended. Electronically Signed   By: Marijo Conception M.D.   On: 12/17/2019 12:42   CT CHEST W CONTRAST  Result Date: 12/17/2019 CLINICAL DATA:  Possible pneumothorax, neck mass EXAM: CT  CHEST WITH CONTRAST TECHNIQUE: Multidetector CT imaging of the chest was performed during intravenous contrast administration. CONTRAST:  50mL OMNIPAQUE IOHEXOL 300 MG/ML  SOLN COMPARISON:  Same day chest radiograph FINDINGS: Cardiovascular: Aortic atherosclerosis. Right upper extremity PICC. Normal heart size. Three-vessel coronary artery calcifications. No pericardial effusion. Mediastinum/Nodes: Extensive pneumomediastinum. No enlarged mediastinal, hilar, or axillary lymph nodes. Grossly enlarged, heterogeneous thyroid goiter, partially imaged. Tracheostomy tube in the included upper trachea. And esophagus demonstrate no significant findings. Lungs/Pleura: Severe centrilobular emphysema. Clustered heterogeneous airspace opacity in the dependent bilateral lung bases. No pleural effusion or pneumothorax. Upper Abdomen: No acute abnormality. Musculoskeletal: No chest wall mass or suspicious bone lesions identified. Severe, very extensive subcutaneous emphysema about the chest, neck, included upper abdomen. Anasarca. IMPRESSION: 1. Extensive pneumomediastinum and severe, very extensive subcutaneous emphysema about the chest, neck, included upper abdomen. There is no obvious source, but presumably is related to recent tracheostomy. 2. No pneumothorax. 3. Clustered heterogeneous airspace opacity in the dependent bilateral lung bases, consistent with aspiration. 4. Severe emphysema.  Emphysema (ICD10-J43.9). 5. Coronary artery disease.  Aortic Atherosclerosis (ICD10-I70.0). 6. Grossly enlarged, heterogeneous thyroid goiter, partially imaged, previously assessed by thyroid ultrasound. Electronically Signed   By: Eddie Candle M.D.   On: 12/17/2019 14:42   US RENAL  Result Date: 12/06/2019 CLINICAL DATA:  Acute kidney injury. EXAM: RENAL / URINARY TRACT ULTRASOUND COMPLETE COMPARISON:  None. FINDINGS: Right Kidney: Renal measurements: 10.2 x 4.8 x 5.7 cm = volume: 147 mL. Increased echogenicity. 1.1 cm shadowing  calculus. No mass or hydronephrosis visualized. Left Kidney: Renal measurements: 11.1 x 5.4 x 4.3 cm = volume: 136 mL. Increased echogenicity. No mass or hydronephrosis visualized. Bladder: Appears normal for degree of bladder distention. Other: None. IMPRESSION: 1. Bilateral increased renal echogenicity, consistent with medical renal disease. 2. Nonobstructive right nephrolithiasis. Electronically Signed   By: Titus Dubin M.D.   On: 12/21/2019 11:39   DG Chest Port 1 View  Result Date: January 13, 2020 CLINICAL DATA:  Subcutaneous emphysema. EXAM: PORTABLE CHEST 1 VIEW COMPARISON:  Yesterday FINDINGS: Tracheostomy tube remains well seated. The enteric tube and right-sided PICC are also in good position. Chest wall emphysema on both sides. Reticulation in the lower lungs is unchanged. No visible pneumothorax or hemothorax. Emphysematous changes at the apices. Normal heart size. IMPRESSION: 1. Unchanged extensive chest wall emphysema. No visible pneumothorax. 2. Atelectasis or pneumonia in both lower lobes. Electronically Signed   By: Monte Fantasia M.D.   On: 01/13/2020 06:18   DG Chest Port 1 View  Result Date: 01/01/2020 CLINICAL DATA:  Respiratory failure EXAM: PORTABLE CHEST 1 VIEW COMPARISON:  12/17/2019 FINDINGS: Tracheostomy in good position. NG tube enters the stomach. Right subclavian central venous catheter tip in the SVC unchanged. Bibasilar airspace disease unchanged. No pneumothorax or pleural effusion. Extensive subcutaneous gas is present in the chest bilaterally which appears slightly improved. IMPRESSION: Bibasilar airspace  disease unchanged.  Possible pneumonia. No pneumothorax.  Mild improvement in subcutaneous emphysema. Electronically Signed   By: Franchot Gallo M.D.   On: 12/12/2019 08:35   DG CHEST PORT 1 VIEW  Result Date: 12/17/2019 CLINICAL DATA:  69 year old female with pneumomediastinum and extensive subcutaneous emphysema. Underlying COPD, acute on chronic respiratory failure.  Status post tracheostomy. EXAM: PORTABLE CHEST 1 VIEW COMPARISON:  Chest CT 1415 hours today. Portable chest 1119 hours today. FINDINGS: Portable AP semi upright view at 1901 hours. Tracheostomy tube, visible enteric tube and right PICC line appears stable. Left and central chest pacer or resuscitation pads now in place. Stable lung volumes. Stable cardiac size and mediastinal contours. Pneumomediastinum better demonstrated by CT. No pneumothorax is evident. Bibasilar reticulonodular opacity appears mildly progressed from this morning and is concordant with the CT appearance of lower lobe airspace disease. Upper lungs remain clear. No new pulmonary abnormality is evident. IMPRESSION: 1.  Stable lines and tubes. 2. Severe subcutaneous emphysema with no pneumothorax evident. Pneumomediastinum better demonstrated by CT today. 3. Bibasilar airspace opacity compatible with aspiration and/or pneumonia as on CT today. No new pulmonary abnormality identified. Electronically Signed   By: Genevie Ann M.D.   On: 12/17/2019 19:24   DG Chest Port 1 View  Result Date: 12/17/2019 CLINICAL DATA:  Pneumothorax, NG tube placement EXAM: PORTABLE CHEST 1 VIEW COMPARISON:  12/16/2019 FINDINGS: Severe diffuse subcutaneous emphysema, new since prior study. Small amount of air noted adjacent to the left heart border could reflect a small medial pneumothorax or pneumomediastinum. No other definite visible pneumothorax. Hyperinflation/COPD. Heart is normal size. Patchy bilateral lower lobe opacities. Tracheostomy and right PICC line remain in place, unchanged. NG tube tip is in the distal esophagus. IMPRESSION: NG tube tip in the distal esophagus. This could be advanced several cm. New extensive subcutaneous emphysema. Small amount of gas along the left heart border could reflect small medial pneumothorax or pneumomediastinum. COPD. Bibasilar atelectasis or infiltrates. Electronically Signed   By: Rolm Baptise M.D.   On: 12/17/2019 11:34    DG Abd Portable 1V  Result Date: 12/17/2019 CLINICAL DATA:  Nasogastric tube placement EXAM: PORTABLE ABDOMEN - 1 VIEW COMPARISON:  10:10 p.m. FINDINGS: Since the prior examination, the nasogastric tube has been advanced and its proximal side hole is now seen within the expected proximal body of the stomach just beyond the gastroesophageal junction. Extensive subcutaneous gas is seen throughout the chest and abdominal wall, similar to that noted on prior examination. Underpenetration precludes evaluation of the abdominal gas pattern. IMPRESSION: Proximal side-hole of the nasogastric tube is now positioned within the proximal body of the stomach. Electronically Signed   By: Fidela Salisbury MD   On: 12/17/2019 23:12   DG Abd Portable 1V  Result Date: 12/17/2019 CLINICAL DATA:  Confirm nasogastric tube placement. EXAM: PORTABLE ABDOMEN - 1 VIEW COMPARISON:  Radiograph earlier this day.  Chest CT earlier today. FINDINGS: Tip of the enteric tube remains in the region of the gastroesophageal junction, side-port in the distal esophagus. Recommend advancement of at least 5 cm for optimal placement. Pneumomediastinum and subcutaneous emphysema as seen on chest CT earlier today. IMPRESSION: Tip of the enteric tube remains in the region of the gastroesophageal junction, side-port in the distal esophagus. Recommend advancement of at least 5 cm for optimal placement. Electronically Signed   By: Keith Rake M.D.   On: 12/17/2019 22:17   DG Abd Portable 1V  Result Date: 12/17/2019 CLINICAL DATA:  Nasogastric tube placement. EXAM: PORTABLE  ABDOMEN - 1 VIEW COMPARISON:  None. FINDINGS: The bowel gas pattern is normal. Nasogastric tube tip is seen in distal esophagus. Extensive subcutaneous emphysema is seen involving the chest which extends down the right lateral abdominal wall. No radio-opaque calculi or other significant radiographic abnormality are seen. IMPRESSION: Nasogastric tube tip seen in distal esophagus;  advancement is recommended. Extensive subcutaneous emphysema is seen involving the chest which extends down the right lateral abdominal wall. Electronically Signed   By: Marijo Conception M.D.   On: 12/17/2019 11:33      Assessment & Plan: Pt is a 69 y.o. female with a PMHx of chronic severe COPD, acute on chronic respiratory failure with failure to wean,, history of goiter, hypertension, paroxysmal atrial fibrillation who was admitted to [facility] 12/06/2019 for ongoing treatment of acute on chronic respiratory failure, pneumomediastinum, acute kidney injury.  1.  Acute kidney injury.  Appears to be multifactorial with the biggest contribution from hypotension recently.  Now oliguric.  BUN up to 150.  Has little muscle mass therefore cannot generate high creatinine.  Recommend initiation of hemodialysis.  We will request interventional radiology to place temporary dialysis catheter.  First dialysis treatment thereafter.  2.  Acute respiratory failure with pneumomediastinum and subcutaneous emphysema.  Still on the ventilator.  Vent support as per pulmonary/critical care.  3.  Anemia unspecified.  May be related to acute kidney injury and decreased production of Epogen.  Hemoglobin 9.6.  Hold off on Epogen at this time.  4.  Guarded prognosis.

## 2020-01-06 DEATH — deceased

## 2020-09-05 IMAGING — DX DG CHEST 1V PORT
1 series · 1 of 1 positions shown · non-contrast
Comparison: 12/17/2019

CLINICAL DATA: Respiratory failure

EXAM:
PORTABLE CHEST 1 VIEW

[chest ap]
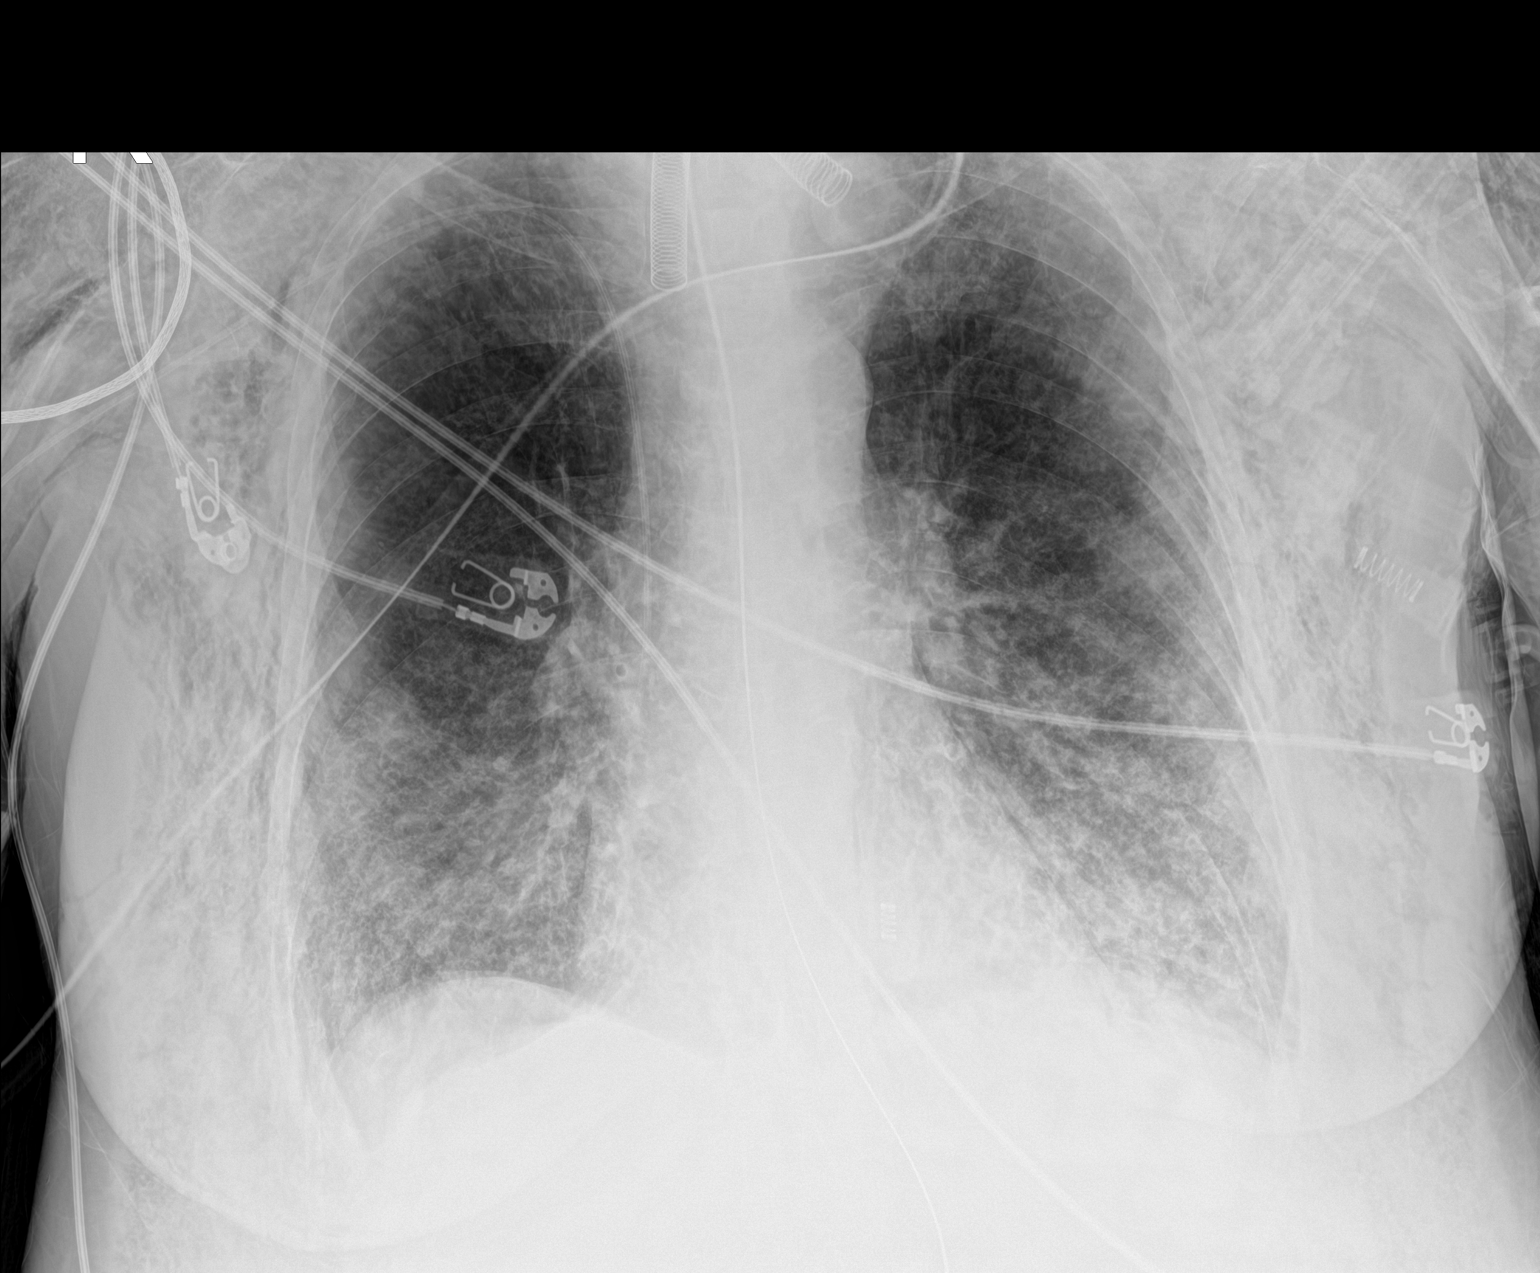

[1 of 1 positions shown; findings below may reference images not displayed]

FINDINGS: Tracheostomy in good position. NG tube enters the stomach. Right
subclavian central venous catheter tip in the SVC unchanged.

Bibasilar airspace disease unchanged. No pneumothorax or pleural
effusion. Extensive subcutaneous gas is present in the chest
bilaterally which appears slightly improved.
IMPRESSION: Bibasilar airspace disease unchanged.  Possible pneumonia.

No pneumothorax.  Mild improvement in subcutaneous emphysema.
